# Patient Record
Sex: Female | Born: 1955
Health system: Southern US, Community
[De-identification: ages and names within clinical notes are randomized; demographics above are authoritative.]

## PROBLEM LIST (undated history)

## (undated) DIAGNOSIS — R0600 Dyspnea, unspecified: Secondary | ICD-10-CM

## (undated) DIAGNOSIS — N3281 Overactive bladder: Secondary | ICD-10-CM

## (undated) DIAGNOSIS — N950 Postmenopausal bleeding: Secondary | ICD-10-CM

## (undated) DIAGNOSIS — E785 Hyperlipidemia, unspecified: Secondary | ICD-10-CM

## (undated) DIAGNOSIS — D219 Benign neoplasm of connective and other soft tissue, unspecified: Secondary | ICD-10-CM

## (undated) DIAGNOSIS — E119 Type 2 diabetes mellitus without complications: Secondary | ICD-10-CM

## (undated) DIAGNOSIS — N915 Oligomenorrhea, unspecified: Secondary | ICD-10-CM

## (undated) DIAGNOSIS — Z8742 Personal history of other diseases of the female genital tract: Secondary | ICD-10-CM

## (undated) DIAGNOSIS — C50912 Malignant neoplasm of unspecified site of left female breast: Secondary | ICD-10-CM

## (undated) DIAGNOSIS — Z8639 Personal history of other endocrine, nutritional and metabolic disease: Secondary | ICD-10-CM

## (undated) DIAGNOSIS — D649 Anemia, unspecified: Secondary | ICD-10-CM

## (undated) DIAGNOSIS — F329 Major depressive disorder, single episode, unspecified: Secondary | ICD-10-CM

## (undated) DIAGNOSIS — N3090 Cystitis, unspecified without hematuria: Secondary | ICD-10-CM

## (undated) DIAGNOSIS — D259 Leiomyoma of uterus, unspecified: Secondary | ICD-10-CM

## (undated) DIAGNOSIS — C50919 Malignant neoplasm of unspecified site of unspecified female breast: Secondary | ICD-10-CM

## (undated) DIAGNOSIS — N3946 Mixed incontinence: Secondary | ICD-10-CM

## (undated) DIAGNOSIS — I8390 Asymptomatic varicose veins of unspecified lower extremity: Secondary | ICD-10-CM

## (undated) DIAGNOSIS — R51 Headache: Secondary | ICD-10-CM

## (undated) DIAGNOSIS — G4700 Insomnia, unspecified: Secondary | ICD-10-CM

## (undated) DIAGNOSIS — R600 Localized edema: Secondary | ICD-10-CM

## (undated) DIAGNOSIS — I10 Essential (primary) hypertension: Secondary | ICD-10-CM

## (undated) DIAGNOSIS — R102 Pelvic and perineal pain: Secondary | ICD-10-CM

## (undated) DIAGNOSIS — IMO0002 Reserved for concepts with insufficient information to code with codable children: Secondary | ICD-10-CM

## (undated) DIAGNOSIS — R9389 Abnormal findings on diagnostic imaging of other specified body structures: Secondary | ICD-10-CM

## (undated) DIAGNOSIS — K5909 Other constipation: Secondary | ICD-10-CM

## (undated) DIAGNOSIS — N83209 Unspecified ovarian cyst, unspecified side: Secondary | ICD-10-CM

## (undated) DIAGNOSIS — Z973 Presence of spectacles and contact lenses: Secondary | ICD-10-CM

## (undated) HISTORY — DX: Personal history of other endocrine, nutritional and metabolic disease: Z86.39

## (undated) HISTORY — DX: Anemia, unspecified: D64.9

## (undated) HISTORY — DX: Type 2 diabetes mellitus without complications: E11.9

## (undated) HISTORY — PX: BREAST SURGERY: SHX581

## (undated) HISTORY — DX: Malignant neoplasm of unspecified site of left female breast: C50.912

## (undated) HISTORY — DX: Essential (primary) hypertension: I10

## (undated) HISTORY — DX: Unspecified ovarian cyst, unspecified side: N83.209

## (undated) HISTORY — DX: Asymptomatic varicose veins of unspecified lower extremity: I83.90

## (undated) HISTORY — DX: Pelvic and perineal pain: R10.2

## (undated) HISTORY — DX: Benign neoplasm of connective and other soft tissue, unspecified: D21.9

## (undated) HISTORY — DX: Major depressive disorder, single episode, unspecified: F32.9

## (undated) HISTORY — DX: Cystitis, unspecified without hematuria: N30.90

## (undated) HISTORY — PX: COLONOSCOPY: SHX174

## (undated) HISTORY — DX: Oligomenorrhea, unspecified: N91.5

## (undated) HISTORY — DX: Headache: R51

## (undated) HISTORY — DX: Personal history of other diseases of the female genital tract: Z87.42

## (undated) HISTORY — DX: Reserved for concepts with insufficient information to code with codable children: IMO0002

## (undated) HISTORY — PX: OTHER SURGICAL HISTORY: SHX169

---

## 1991-05-25 DIAGNOSIS — IMO0002 Reserved for concepts with insufficient information to code with codable children: Secondary | ICD-10-CM

## 1991-05-25 DIAGNOSIS — Z8742 Personal history of other diseases of the female genital tract: Secondary | ICD-10-CM

## 1991-05-25 DIAGNOSIS — R87619 Unspecified abnormal cytological findings in specimens from cervix uteri: Secondary | ICD-10-CM

## 1991-05-25 HISTORY — DX: Personal history of other diseases of the female genital tract: Z87.42

## 1991-05-25 HISTORY — DX: Unspecified abnormal cytological findings in specimens from cervix uteri: R87.619

## 1991-05-25 HISTORY — DX: Reserved for concepts with insufficient information to code with codable children: IMO0002

## 1991-06-18 HISTORY — PX: COLPOSCOPY: SHX161

## 1991-07-16 DIAGNOSIS — F32A Depression, unspecified: Secondary | ICD-10-CM

## 1991-07-16 HISTORY — DX: Depression, unspecified: F32.A

## 1992-05-17 HISTORY — PX: DIAGNOSTIC LAPAROSCOPY: SUR761

## 1995-05-18 HISTORY — PX: TUBAL LIGATION: SHX77

## 1995-05-18 HISTORY — PX: LAPAROSCOPIC TUBAL LIGATION: SHX1937

## 1998-11-11 ENCOUNTER — Other Ambulatory Visit: Admission: RE | Admit: 1998-11-11 | Discharge: 1998-11-11 | Payer: Self-pay | Admitting: Obstetrics and Gynecology

## 1998-12-26 ENCOUNTER — Ambulatory Visit (HOSPITAL_COMMUNITY): Admission: RE | Admit: 1998-12-26 | Discharge: 1998-12-26 | Payer: Self-pay | Admitting: Gastroenterology

## 1999-01-06 ENCOUNTER — Other Ambulatory Visit: Admission: RE | Admit: 1999-01-06 | Discharge: 1999-01-06 | Payer: Self-pay | Admitting: Family Medicine

## 1999-12-29 ENCOUNTER — Other Ambulatory Visit: Admission: RE | Admit: 1999-12-29 | Discharge: 1999-12-29 | Payer: Self-pay | Admitting: Obstetrics and Gynecology

## 2000-01-21 ENCOUNTER — Encounter: Payer: Self-pay | Admitting: Obstetrics and Gynecology

## 2000-01-21 ENCOUNTER — Ambulatory Visit (HOSPITAL_COMMUNITY): Admission: RE | Admit: 2000-01-21 | Discharge: 2000-01-21 | Payer: Self-pay | Admitting: Obstetrics and Gynecology

## 2000-01-26 ENCOUNTER — Encounter: Admission: RE | Admit: 2000-01-26 | Discharge: 2000-01-26 | Payer: Self-pay | Admitting: Obstetrics and Gynecology

## 2000-01-26 ENCOUNTER — Encounter: Payer: Self-pay | Admitting: Obstetrics and Gynecology

## 2000-11-25 ENCOUNTER — Encounter: Payer: Self-pay | Admitting: Neurology

## 2000-11-25 ENCOUNTER — Ambulatory Visit (HOSPITAL_COMMUNITY): Admission: RE | Admit: 2000-11-25 | Discharge: 2000-11-25 | Payer: Self-pay | Admitting: Neurology

## 2000-11-29 ENCOUNTER — Ambulatory Visit (HOSPITAL_COMMUNITY): Admission: RE | Admit: 2000-11-29 | Discharge: 2000-11-29 | Payer: Self-pay | Admitting: Family Medicine

## 2000-11-29 ENCOUNTER — Encounter: Payer: Self-pay | Admitting: Family Medicine

## 2001-01-30 ENCOUNTER — Ambulatory Visit (HOSPITAL_COMMUNITY): Admission: RE | Admit: 2001-01-30 | Discharge: 2001-01-30 | Payer: Self-pay | Admitting: Family Medicine

## 2001-01-30 ENCOUNTER — Encounter: Payer: Self-pay | Admitting: Family Medicine

## 2002-05-09 ENCOUNTER — Encounter: Payer: Self-pay | Admitting: Obstetrics and Gynecology

## 2002-05-09 ENCOUNTER — Encounter: Admission: RE | Admit: 2002-05-09 | Discharge: 2002-05-09 | Payer: Self-pay | Admitting: Obstetrics and Gynecology

## 2002-06-07 ENCOUNTER — Other Ambulatory Visit: Admission: RE | Admit: 2002-06-07 | Discharge: 2002-06-07 | Payer: Self-pay | Admitting: Obstetrics and Gynecology

## 2002-06-14 ENCOUNTER — Ambulatory Visit (HOSPITAL_COMMUNITY): Admission: RE | Admit: 2002-06-14 | Discharge: 2002-06-14 | Payer: Self-pay | Admitting: Obstetrics and Gynecology

## 2002-06-14 ENCOUNTER — Encounter: Payer: Self-pay | Admitting: Obstetrics and Gynecology

## 2003-09-12 ENCOUNTER — Ambulatory Visit (HOSPITAL_COMMUNITY): Admission: RE | Admit: 2003-09-12 | Discharge: 2003-09-12 | Payer: Self-pay | Admitting: Obstetrics and Gynecology

## 2003-09-17 ENCOUNTER — Encounter: Admission: RE | Admit: 2003-09-17 | Discharge: 2003-09-17 | Payer: Self-pay | Admitting: Obstetrics and Gynecology

## 2004-08-26 ENCOUNTER — Other Ambulatory Visit: Admission: RE | Admit: 2004-08-26 | Discharge: 2004-08-26 | Payer: Self-pay | Admitting: Obstetrics and Gynecology

## 2004-09-10 ENCOUNTER — Encounter: Admission: RE | Admit: 2004-09-10 | Discharge: 2004-09-10 | Payer: Self-pay | Admitting: Obstetrics and Gynecology

## 2005-09-05 ENCOUNTER — Emergency Department (HOSPITAL_COMMUNITY): Admission: EM | Admit: 2005-09-05 | Discharge: 2005-09-05 | Payer: Self-pay | Admitting: Emergency Medicine

## 2006-05-17 DIAGNOSIS — C50812 Malignant neoplasm of overlapping sites of left female breast: Secondary | ICD-10-CM

## 2006-05-17 HISTORY — PX: MASTECTOMY: SHX3

## 2006-05-17 HISTORY — DX: Malignant neoplasm of overlapping sites of left female breast: C50.812

## 2006-05-18 ENCOUNTER — Encounter: Admission: RE | Admit: 2006-05-18 | Discharge: 2006-05-18 | Payer: Self-pay | Admitting: Obstetrics and Gynecology

## 2006-06-02 ENCOUNTER — Encounter: Admission: RE | Admit: 2006-06-02 | Discharge: 2006-06-02 | Payer: Self-pay | Admitting: Obstetrics and Gynecology

## 2006-06-08 ENCOUNTER — Encounter (INDEPENDENT_AMBULATORY_CARE_PROVIDER_SITE_OTHER): Payer: Self-pay | Admitting: Diagnostic Radiology

## 2006-06-08 ENCOUNTER — Encounter (INDEPENDENT_AMBULATORY_CARE_PROVIDER_SITE_OTHER): Payer: Self-pay | Admitting: Specialist

## 2006-06-08 ENCOUNTER — Encounter: Admission: RE | Admit: 2006-06-08 | Discharge: 2006-06-08 | Payer: Self-pay | Admitting: Obstetrics and Gynecology

## 2006-06-24 ENCOUNTER — Encounter: Admission: RE | Admit: 2006-06-24 | Discharge: 2006-06-24 | Payer: Self-pay | Admitting: Orthopedic Surgery

## 2006-07-08 ENCOUNTER — Encounter (INDEPENDENT_AMBULATORY_CARE_PROVIDER_SITE_OTHER): Payer: Self-pay | Admitting: Specialist

## 2006-07-08 ENCOUNTER — Encounter: Admission: RE | Admit: 2006-07-08 | Discharge: 2006-07-08 | Payer: Self-pay | Admitting: Obstetrics and Gynecology

## 2006-07-08 ENCOUNTER — Encounter (INDEPENDENT_AMBULATORY_CARE_PROVIDER_SITE_OTHER): Payer: Self-pay | Admitting: Diagnostic Radiology

## 2006-08-19 ENCOUNTER — Ambulatory Visit (HOSPITAL_COMMUNITY): Admission: RE | Admit: 2006-08-19 | Discharge: 2006-08-21 | Payer: Self-pay | Admitting: General Surgery

## 2006-08-19 ENCOUNTER — Encounter (INDEPENDENT_AMBULATORY_CARE_PROVIDER_SITE_OTHER): Payer: Self-pay | Admitting: Specialist

## 2006-08-19 HISTORY — PX: SIMPLE MASTECTOMY WITH AXILLARY SENTINEL NODE BIOPSY: SHX6098

## 2006-09-07 ENCOUNTER — Encounter: Admission: RE | Admit: 2006-09-07 | Discharge: 2006-12-06 | Payer: Self-pay | Admitting: *Deleted

## 2006-09-14 ENCOUNTER — Ambulatory Visit: Payer: Self-pay | Admitting: Oncology

## 2006-09-28 LAB — COMPREHENSIVE METABOLIC PANEL
AST: 10 U/L (ref 0–37)
Albumin: 3.4 g/dL — ABNORMAL LOW (ref 3.5–5.2)
BUN: 6 mg/dL (ref 6–23)
Calcium: 8.9 mg/dL (ref 8.4–10.5)
Chloride: 104 mEq/L (ref 96–112)
Creatinine, Ser: 0.56 mg/dL (ref 0.40–1.20)
Glucose, Bld: 141 mg/dL — ABNORMAL HIGH (ref 70–99)
Potassium: 4.2 mEq/L (ref 3.5–5.3)

## 2006-09-28 LAB — CBC WITH DIFFERENTIAL/PLATELET
Basophils Absolute: 0.1 10*3/uL (ref 0.0–0.1)
EOS%: 1 % (ref 0.0–7.0)
Eosinophils Absolute: 0.1 10*3/uL (ref 0.0–0.5)
HCT: 31.9 % — ABNORMAL LOW (ref 34.8–46.6)
HGB: 10.4 g/dL — ABNORMAL LOW (ref 11.6–15.9)
MCH: 27.1 pg (ref 26.0–34.0)
MCV: 83.5 fL (ref 81.0–101.0)
MONO%: 2.8 % (ref 0.0–13.0)
NEUT#: 6.5 10*3/uL (ref 1.5–6.5)
NEUT%: 70.6 % (ref 39.6–76.8)
lymph#: 2.3 10*3/uL (ref 0.9–3.3)

## 2006-09-28 LAB — CANCER ANTIGEN 27.29: CA 27.29: 11 U/mL (ref 0–39)

## 2006-09-28 LAB — LACTATE DEHYDROGENASE: LDH: 117 U/L (ref 94–250)

## 2006-11-15 ENCOUNTER — Ambulatory Visit: Payer: Self-pay | Admitting: Oncology

## 2006-11-21 LAB — CBC WITH DIFFERENTIAL/PLATELET
EOS%: 0.7 % (ref 0.0–7.0)
HGB: 12.5 g/dL (ref 11.6–15.9)
MCH: 26.9 pg (ref 26.0–34.0)
MCV: 80.8 fL — ABNORMAL LOW (ref 81.0–101.0)
MONO%: 4 % (ref 0.0–13.0)
NEUT#: 4.2 10*3/uL (ref 1.5–6.5)
RBC: 4.63 10*6/uL (ref 3.70–5.32)
RDW: 16.5 % — ABNORMAL HIGH (ref 11.3–14.5)
lymph#: 3 10*3/uL (ref 0.9–3.3)

## 2006-11-21 LAB — COMPREHENSIVE METABOLIC PANEL
ALT: 9 U/L (ref 0–35)
AST: 15 U/L (ref 0–37)
Albumin: 4.5 g/dL (ref 3.5–5.2)
Alkaline Phosphatase: 67 U/L (ref 39–117)
Calcium: 9.9 mg/dL (ref 8.4–10.5)
Chloride: 102 mEq/L (ref 96–112)
Potassium: 4.5 mEq/L (ref 3.5–5.3)
Sodium: 138 mEq/L (ref 135–145)
Total Protein: 8.2 g/dL (ref 6.0–8.3)

## 2007-01-13 ENCOUNTER — Ambulatory Visit: Payer: Self-pay | Admitting: Oncology

## 2007-01-19 LAB — CBC WITH DIFFERENTIAL/PLATELET
BASO%: 0.5 % (ref 0.0–2.0)
EOS%: 0.8 % (ref 0.0–7.0)
Eosinophils Absolute: 0 10*3/uL (ref 0.0–0.5)
LYMPH%: 49.8 % — ABNORMAL HIGH (ref 14.0–48.0)
MCH: 27.7 pg (ref 26.0–34.0)
MCHC: 33.7 g/dL (ref 32.0–36.0)
MCV: 82.2 fL (ref 81.0–101.0)
MONO%: 6.1 % (ref 0.0–13.0)
Platelets: 186 10*3/uL (ref 145–400)
RBC: 3.96 10*6/uL (ref 3.70–5.32)

## 2007-01-19 LAB — COMPREHENSIVE METABOLIC PANEL
ALT: 9 U/L (ref 0–35)
AST: 13 U/L (ref 0–37)
Alkaline Phosphatase: 53 U/L (ref 39–117)
Creatinine, Ser: 0.64 mg/dL (ref 0.40–1.20)
Sodium: 137 mEq/L (ref 135–145)
Total Bilirubin: 0.3 mg/dL (ref 0.3–1.2)

## 2007-01-19 LAB — CANCER ANTIGEN 27.29: CA 27.29: 13 U/mL (ref 0–39)

## 2007-03-09 ENCOUNTER — Ambulatory Visit (HOSPITAL_COMMUNITY): Admission: RE | Admit: 2007-03-09 | Discharge: 2007-03-09 | Payer: Self-pay | Admitting: Obstetrics and Gynecology

## 2007-05-24 ENCOUNTER — Inpatient Hospital Stay (HOSPITAL_COMMUNITY): Admission: AD | Admit: 2007-05-24 | Discharge: 2007-05-24 | Payer: Self-pay | Admitting: Obstetrics and Gynecology

## 2007-06-05 ENCOUNTER — Encounter: Admission: RE | Admit: 2007-06-05 | Discharge: 2007-06-05 | Payer: Self-pay | Admitting: Gastroenterology

## 2007-07-17 ENCOUNTER — Ambulatory Visit: Payer: Self-pay | Admitting: Oncology

## 2007-07-19 LAB — CBC WITH DIFFERENTIAL/PLATELET
Basophils Absolute: 0 10*3/uL (ref 0.0–0.1)
Eosinophils Absolute: 0.1 10*3/uL (ref 0.0–0.5)
HGB: 11.4 g/dL — ABNORMAL LOW (ref 11.6–15.9)
MCV: 82.6 fL (ref 81.0–101.0)
MONO#: 0.4 10*3/uL (ref 0.1–0.9)
MONO%: 6.4 % (ref 0.0–13.0)
NEUT#: 2.5 10*3/uL (ref 1.5–6.5)
RDW: 15.1 % — ABNORMAL HIGH (ref 11.3–14.5)

## 2007-07-19 LAB — COMPREHENSIVE METABOLIC PANEL
Albumin: 3.3 g/dL — ABNORMAL LOW (ref 3.5–5.2)
Alkaline Phosphatase: 58 U/L (ref 39–117)
BUN: 11 mg/dL (ref 6–23)
CO2: 25 mEq/L (ref 19–32)
Calcium: 9 mg/dL (ref 8.4–10.5)
Chloride: 108 mEq/L (ref 96–112)
Glucose, Bld: 118 mg/dL — ABNORMAL HIGH (ref 70–99)
Potassium: 4.1 mEq/L (ref 3.5–5.3)

## 2007-07-20 ENCOUNTER — Ambulatory Visit (HOSPITAL_COMMUNITY): Admission: RE | Admit: 2007-07-20 | Discharge: 2007-07-20 | Payer: Self-pay | Admitting: Obstetrics and Gynecology

## 2007-07-20 ENCOUNTER — Encounter (INDEPENDENT_AMBULATORY_CARE_PROVIDER_SITE_OTHER): Payer: Self-pay | Admitting: Obstetrics and Gynecology

## 2007-07-20 HISTORY — PX: DILATATION & CURRETTAGE/HYSTEROSCOPY WITH RESECTOCOPE: SHX5572

## 2007-07-20 HISTORY — PX: HYSTEROSCOPY WITH D & C: SHX1775

## 2007-07-20 LAB — CANCER ANTIGEN 27.29: CA 27.29: 14 U/mL (ref 0–39)

## 2007-08-01 ENCOUNTER — Encounter: Admission: RE | Admit: 2007-08-01 | Discharge: 2007-08-01 | Payer: Self-pay | Admitting: Oncology

## 2007-08-18 ENCOUNTER — Encounter (INDEPENDENT_AMBULATORY_CARE_PROVIDER_SITE_OTHER): Payer: Self-pay | Admitting: Diagnostic Radiology

## 2007-08-18 ENCOUNTER — Encounter: Admission: RE | Admit: 2007-08-18 | Discharge: 2007-08-18 | Payer: Self-pay | Admitting: Oncology

## 2007-08-21 LAB — ESTRADIOL, ULTRA SENS: Estradiol, Ultra Sensitive: 132 pg/mL

## 2007-10-27 ENCOUNTER — Emergency Department: Payer: Self-pay | Admitting: Emergency Medicine

## 2007-10-27 ENCOUNTER — Other Ambulatory Visit: Payer: Self-pay

## 2008-01-18 ENCOUNTER — Ambulatory Visit: Payer: Self-pay | Admitting: Oncology

## 2008-03-18 ENCOUNTER — Ambulatory Visit: Payer: Self-pay | Admitting: Oncology

## 2008-03-20 LAB — CBC WITH DIFFERENTIAL/PLATELET
Basophils Absolute: 0.1 10*3/uL (ref 0.0–0.1)
HCT: 36.1 % (ref 34.8–46.6)
HGB: 11.9 g/dL (ref 11.6–15.9)
MONO#: 0.2 10*3/uL (ref 0.1–0.9)
NEUT%: 46.1 % (ref 39.6–76.8)
WBC: 6.6 10*3/uL (ref 3.9–10.0)
lymph#: 3.2 10*3/uL (ref 0.9–3.3)

## 2008-03-20 LAB — COMPREHENSIVE METABOLIC PANEL
ALT: 22 U/L (ref 0–35)
Alkaline Phosphatase: 63 U/L (ref 39–117)
Glucose, Bld: 150 mg/dL — ABNORMAL HIGH (ref 70–99)
Sodium: 137 mEq/L (ref 135–145)
Total Bilirubin: 0.3 mg/dL (ref 0.3–1.2)
Total Protein: 7.7 g/dL (ref 6.0–8.3)

## 2008-03-20 LAB — CANCER ANTIGEN 27.29: CA 27.29: 15 U/mL (ref 0–39)

## 2008-03-29 ENCOUNTER — Encounter: Admission: RE | Admit: 2008-03-29 | Discharge: 2008-03-29 | Payer: Self-pay | Admitting: Oncology

## 2008-03-31 LAB — ESTRADIOL, ULTRA SENS

## 2008-09-09 ENCOUNTER — Ambulatory Visit: Payer: Self-pay | Admitting: Oncology

## 2008-10-30 ENCOUNTER — Ambulatory Visit: Payer: Self-pay | Admitting: Oncology

## 2008-11-08 LAB — CBC WITH DIFFERENTIAL/PLATELET
BASO%: 0.1 % (ref 0.0–2.0)
HCT: 36.3 % (ref 34.8–46.6)
LYMPH%: 48.1 % (ref 14.0–49.7)
MCHC: 33.4 g/dL (ref 31.5–36.0)
MCV: 85.3 fL (ref 79.5–101.0)
MONO#: 0.4 10*3/uL (ref 0.1–0.9)
MONO%: 5.9 % (ref 0.0–14.0)
NEUT%: 43.8 % (ref 38.4–76.8)
Platelets: 179 10*3/uL (ref 145–400)
WBC: 6.7 10*3/uL (ref 3.9–10.3)

## 2008-11-08 LAB — CANCER ANTIGEN 27.29: CA 27.29: 18 U/mL (ref 0–39)

## 2008-11-08 LAB — COMPREHENSIVE METABOLIC PANEL
ALT: 24 U/L (ref 0–35)
CO2: 28 mEq/L (ref 19–32)
Creatinine, Ser: 0.58 mg/dL (ref 0.40–1.20)
Glucose, Bld: 79 mg/dL (ref 70–99)
Total Bilirubin: 0.4 mg/dL (ref 0.3–1.2)

## 2008-11-12 ENCOUNTER — Encounter: Admission: RE | Admit: 2008-11-12 | Discharge: 2008-11-12 | Payer: Self-pay | Admitting: Oncology

## 2009-01-25 IMAGING — CR DG CHEST 2V
2 series · 2 of 2 positions shown · non-contrast
Comparison: none
The heart size and mediastinal contours are within normal limits.  Both lungs are clear.  The visualized skeletal structures are unremarkable.

CLINICAL DATA: Left breast carcinoma.  Preadmission evaluation.
MG1CA-B VIEWS:

[view not recorded (1 of 2)]
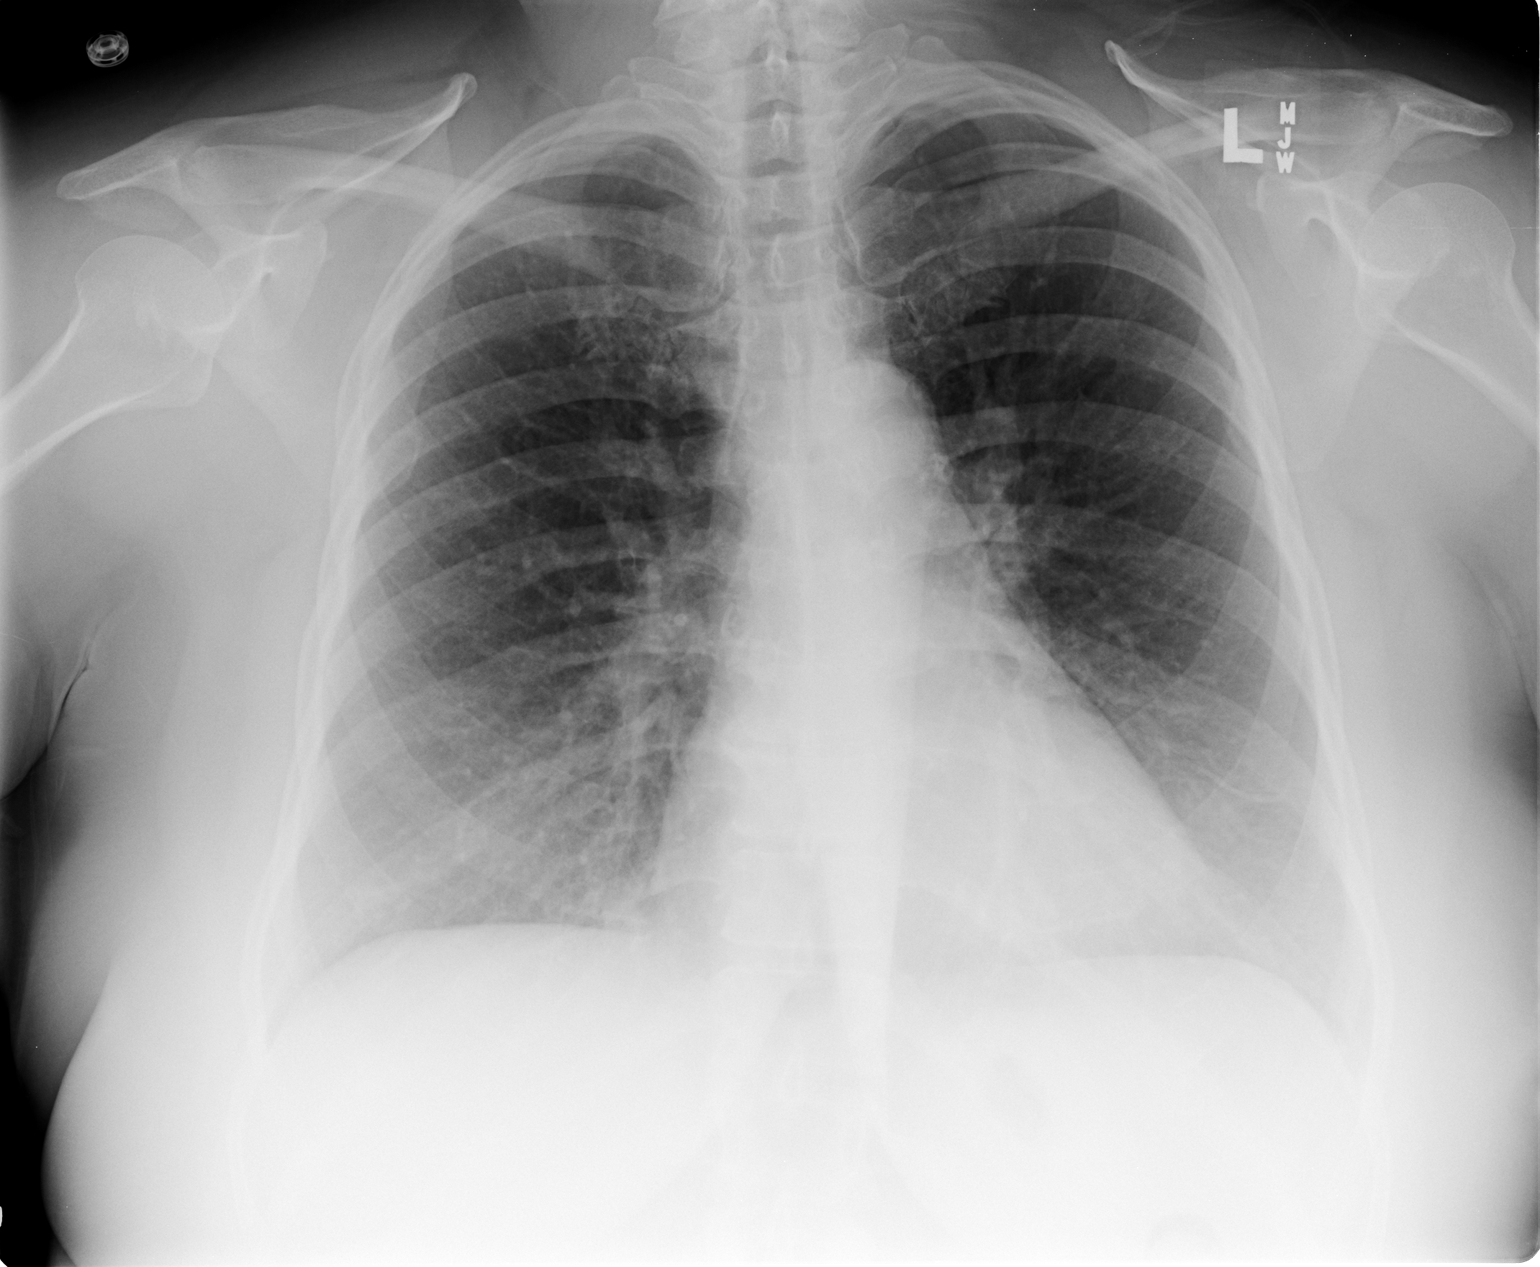

[view not recorded (2 of 2)]
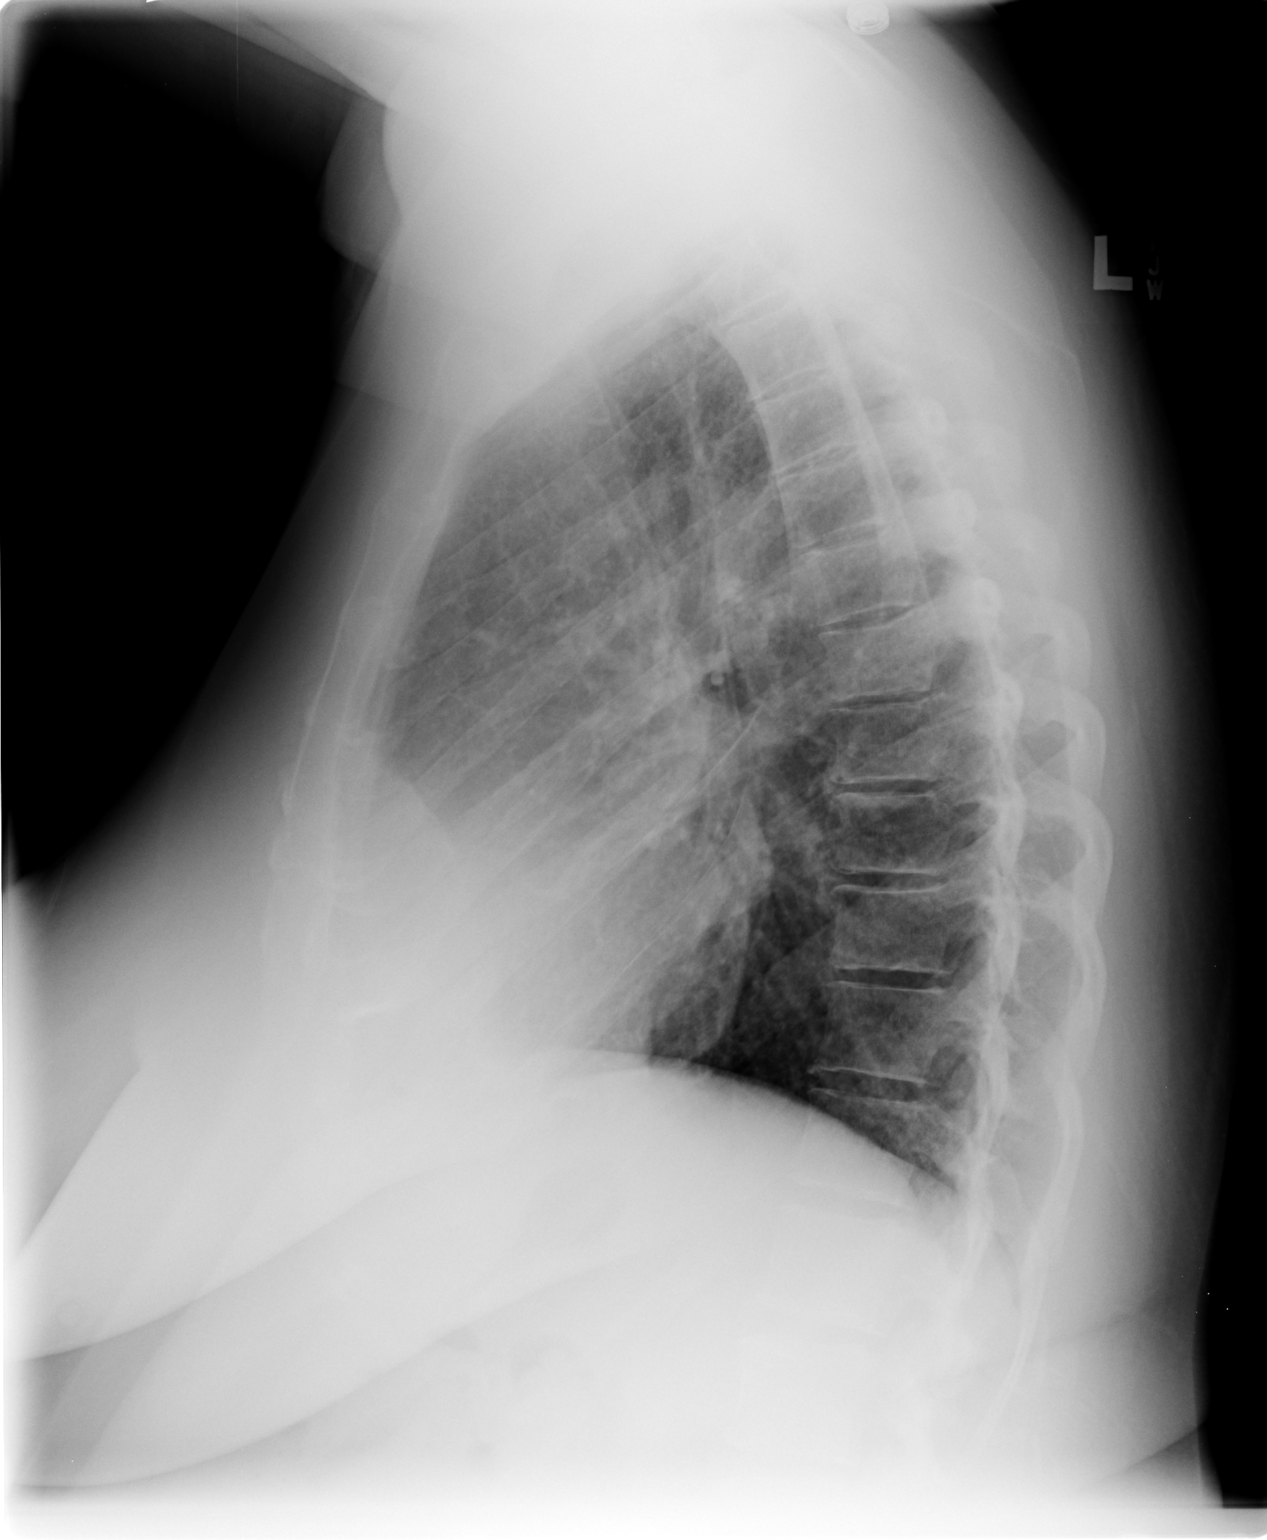

[2 of 2 positions shown; findings below may reference images not displayed]

IMPRESSION: No active cardiopulmonary disease.

## 2009-05-13 ENCOUNTER — Ambulatory Visit: Payer: Self-pay | Admitting: Oncology

## 2009-07-14 ENCOUNTER — Ambulatory Visit: Payer: Self-pay | Admitting: Oncology

## 2009-07-17 LAB — CBC WITH DIFFERENTIAL/PLATELET
Basophils Absolute: 0.1 10*3/uL (ref 0.0–0.1)
Eosinophils Absolute: 0.1 10*3/uL (ref 0.0–0.5)
HCT: 35.4 % (ref 34.8–46.6)
HGB: 11.7 g/dL (ref 11.6–15.9)
NEUT#: 3.3 10*3/uL (ref 1.5–6.5)
RDW: 14.4 % (ref 11.2–14.5)
lymph#: 3 10*3/uL (ref 0.9–3.3)

## 2009-07-17 LAB — COMPREHENSIVE METABOLIC PANEL
Albumin: 3.4 g/dL — ABNORMAL LOW (ref 3.5–5.2)
BUN: 10 mg/dL (ref 6–23)
CO2: 26 mEq/L (ref 19–32)
Calcium: 8.9 mg/dL (ref 8.4–10.5)
Chloride: 106 mEq/L (ref 96–112)
Glucose, Bld: 170 mg/dL — ABNORMAL HIGH (ref 70–99)
Potassium: 4 mEq/L (ref 3.5–5.3)

## 2009-07-17 LAB — VITAMIN D 25 HYDROXY (VIT D DEFICIENCY, FRACTURES): Vit D, 25-Hydroxy: 19 ng/mL — ABNORMAL LOW (ref 30–89)

## 2009-07-17 LAB — CANCER ANTIGEN 27.29: CA 27.29: 13 U/mL (ref 0–39)

## 2009-08-15 ENCOUNTER — Ambulatory Visit: Payer: Self-pay | Admitting: Oncology

## 2009-11-19 ENCOUNTER — Ambulatory Visit: Payer: Self-pay | Admitting: Oncology

## 2009-12-12 ENCOUNTER — Encounter: Admission: RE | Admit: 2009-12-12 | Discharge: 2009-12-12 | Payer: Self-pay | Admitting: Internal Medicine

## 2009-12-23 ENCOUNTER — Encounter: Admission: RE | Admit: 2009-12-23 | Discharge: 2009-12-23 | Payer: Self-pay | Admitting: Oncology

## 2010-02-09 ENCOUNTER — Ambulatory Visit: Payer: Self-pay | Admitting: Oncology

## 2010-03-06 LAB — COMPREHENSIVE METABOLIC PANEL
AST: 23 U/L (ref 0–37)
Albumin: 3.4 g/dL — ABNORMAL LOW (ref 3.5–5.2)
Alkaline Phosphatase: 67 U/L (ref 39–117)
BUN: 10 mg/dL (ref 6–23)
CO2: 28 mEq/L (ref 19–32)
Chloride: 109 mEq/L (ref 96–112)
Glucose, Bld: 140 mg/dL — ABNORMAL HIGH (ref 70–99)
Potassium: 4.1 mEq/L (ref 3.5–5.3)
Sodium: 142 mEq/L (ref 135–145)
Total Bilirubin: 0.2 mg/dL — ABNORMAL LOW (ref 0.3–1.2)
Total Protein: 7 g/dL (ref 6.0–8.3)

## 2010-03-06 LAB — CBC WITH DIFFERENTIAL/PLATELET
EOS%: 1.2 % (ref 0.0–7.0)
HCT: 34.7 % — ABNORMAL LOW (ref 34.8–46.6)
MCH: 27.9 pg (ref 25.1–34.0)
MCHC: 32.7 g/dL (ref 31.5–36.0)
MCV: 85.2 fL (ref 79.5–101.0)
MONO#: 0.4 10*3/uL (ref 0.1–0.9)
MONO%: 5.8 % (ref 0.0–14.0)
Platelets: 178 10*3/uL (ref 145–400)

## 2010-03-07 LAB — VITAMIN D 25 HYDROXY (VIT D DEFICIENCY, FRACTURES): Vit D, 25-Hydroxy: 36 ng/mL (ref 30–89)

## 2010-06-06 ENCOUNTER — Encounter: Payer: Self-pay | Admitting: Oncology

## 2010-06-07 ENCOUNTER — Encounter (HOSPITAL_BASED_OUTPATIENT_CLINIC_OR_DEPARTMENT_OTHER): Payer: Self-pay | Admitting: General Surgery

## 2010-06-07 ENCOUNTER — Encounter: Payer: Self-pay | Admitting: Obstetrics and Gynecology

## 2010-06-18 ENCOUNTER — Other Ambulatory Visit: Payer: Self-pay | Admitting: Oncology

## 2010-06-18 DIAGNOSIS — Z9012 Acquired absence of left breast and nipple: Secondary | ICD-10-CM

## 2010-06-24 ENCOUNTER — Ambulatory Visit
Admission: RE | Admit: 2010-06-24 | Discharge: 2010-06-24 | Disposition: A | Discharge status: Home / Self Care | Payer: BC Managed Care – PPO | Source: Ambulatory Visit | Attending: Oncology | Admitting: Oncology

## 2010-06-24 ENCOUNTER — Other Ambulatory Visit: Payer: Self-pay | Admitting: Oncology

## 2010-06-24 DIAGNOSIS — Z9012 Acquired absence of left breast and nipple: Secondary | ICD-10-CM

## 2010-07-01 ENCOUNTER — Other Ambulatory Visit: Payer: Self-pay

## 2010-07-22 ENCOUNTER — Ambulatory Visit
Admission: RE | Admit: 2010-07-22 | Discharge: 2010-07-22 | Disposition: A | Discharge status: Home / Self Care | Payer: BC Managed Care – PPO | Source: Ambulatory Visit | Attending: Oncology | Admitting: Oncology

## 2010-07-22 DIAGNOSIS — Z9012 Acquired absence of left breast and nipple: Secondary | ICD-10-CM

## 2010-07-22 MED ORDER — GADOBENATE DIMEGLUMINE 529 MG/ML IV SOLN
19.0000 mL | Freq: Once | INTRAVENOUS | Status: AC | PRN
  Administered 2010-07-22: 19 mL via INTRAVENOUS

## 2010-08-20 ENCOUNTER — Other Ambulatory Visit: Payer: Self-pay | Admitting: Oncology

## 2010-08-20 ENCOUNTER — Encounter (HOSPITAL_BASED_OUTPATIENT_CLINIC_OR_DEPARTMENT_OTHER): Payer: BC Managed Care – PPO | Admitting: Oncology

## 2010-08-20 DIAGNOSIS — C50919 Malignant neoplasm of unspecified site of unspecified female breast: Secondary | ICD-10-CM

## 2010-08-20 DIAGNOSIS — C50419 Malignant neoplasm of upper-outer quadrant of unspecified female breast: Secondary | ICD-10-CM

## 2010-08-20 DIAGNOSIS — Z170 Estrogen receptor positive status [ER+]: Secondary | ICD-10-CM

## 2010-08-20 LAB — CBC WITH DIFFERENTIAL/PLATELET
BASO%: 0.4 % (ref 0.0–2.0)
LYMPH%: 41.9 % (ref 14.0–49.7)
MCHC: 33.1 g/dL (ref 31.5–36.0)
MONO#: 0.4 10*3/uL (ref 0.1–0.9)
Platelets: 159 10*3/uL (ref 145–400)
RBC: 4.17 10*6/uL (ref 3.70–5.45)
RDW: 15.3 % — ABNORMAL HIGH (ref 11.2–14.5)
WBC: 6.6 10*3/uL (ref 3.9–10.3)
lymph#: 2.8 10*3/uL (ref 0.9–3.3)

## 2010-08-20 LAB — COMPREHENSIVE METABOLIC PANEL
AST: 21 U/L (ref 0–37)
Albumin: 3.5 g/dL (ref 3.5–5.2)
Alkaline Phosphatase: 63 U/L (ref 39–117)
BUN: 14 mg/dL (ref 6–23)
Creatinine, Ser: 0.82 mg/dL (ref 0.40–1.20)
Glucose, Bld: 117 mg/dL — ABNORMAL HIGH (ref 70–99)
Potassium: 4.3 mEq/L (ref 3.5–5.3)
Total Bilirubin: 0.6 mg/dL (ref 0.3–1.2)

## 2010-08-21 LAB — VITAMIN D 25 HYDROXY (VIT D DEFICIENCY, FRACTURES): Vit D, 25-Hydroxy: 32 ng/mL (ref 30–89)

## 2010-09-29 NOTE — Consult Note (Signed)
NAMEMONIC, ENGELMANN NO.:  [id number]   MEDICAL RECORD NO.:  [id number]          PATIENT TYPE:  MAT   LOCATION:  [facility]                          FACILITY:  [facility]   PHYSICIAN:  Janine Limbo, M.D.DATE OF BIRTH:  07-Mar-1956   DATE OF CONSULTATION:  05/24/2007  DATE OF DISCHARGE:  05/24/2007                                 CONSULTATION   REFERRING PHYSICIAN:  Dr. Erie Noe P. Haygood   HISTORY OF PRESENT ILLNESS:  [name] is a 55 year old female, para  2-0-0-2, who presents to the maternity admissions area at the [facility] complaining of heavy vaginal bleeding.  The  patient has been followed at the [facility] of  [facility] for women.  The patient has a history of irregular  bleeding and the patient says that she thinks she has been told in the  past that she has fibroids.  The patient has never really ceased having  her periods in spite of her age of 60.  She was recently diagnosed with  breast cancer and her breast cancer is estrogen receptor positive and  progesterone receptor positive.  She has been warned not to take any  hormones at all.  The patient reports that at work today she felt  slightly dizzy and felt that she needed to be evaluated.  She says that  her bleeding was heavy enough that she soaked through a pad every 3  hours.  The patient is status post tubal ligation in 1998.  Her most  recent Pap smear was within normal limits and it was done 1 month ago.  She said that she has had an abnormal Pap smear in the past but that her  follow-up evaluation showed that the cells were once again normal.   PAST MEDICAL HISTORY:  The patient was diagnosed with breast cancer as  mentioned above.  Her breast cancer is estrogen receptor positive and  progesterone receptor positive.  She has high cholesterol but is  currently not on medication.   DRUG ALLERGIES:  The patient is allergic to AMOXICILLIN and  she reports  that amoxicillin causes her face to swell.  She is also allergic to  KIWI.  She denies latex allergies and she denies Betadine allergies.   SOCIAL HISTORY:  The patient denies cigarette use, alcohol use, and  recreational drug use.   REVIEW OF SYSTEMS:  The patient complains of constipation and she also  complains of urinary incontinence.   FAMILY HISTORY:  The patient has a half sister with hypertension and a  half-sister with diabetes.  She has two aunts with breast cancer.   OBSTETRICAL HISTORY:  The patient has had two term vaginal deliveries.   PHYSICAL EXAM:  VITAL SIGNS:  Temperature is 98.0, oxygen saturation is  99% on room air, respirations are 20, orthostatic blood pressures and  pulses show a pulse of 82 when the patient is lying and a blood pressure  of 141/69.  Her pulse is 84 when she sits and her blood pressure is  159/64.  Her pulse is 98 when she stands  and her blood pressure is  130/73.  HEENT:  Is within normal limits.  CHEST:  Chest is clear.  HEART:  Regular rate and rhythm.  ABDOMEN:  Abdomen is nontender.  Her bowel sounds are normal.  EXTREMITIES:  Her extremities are grossly normal.  PELVIC EXAM:  External genitalia is normal.  The vagina is normal except  for a small to moderate amount of blood.  The cervix is nontender.  The  uterus is 8-10 weeks size and irregular.  Adnexa:  No masses are  appreciated.   LABORATORY VALUES:  Hemoglobin is 10.6, hematocrit is 31.8%.  White  blood cell count is 6300, platelet count is 203,000.  Sodium is 138,  potassium is 3.8, chloride is 105, carbon dioxide of 25, glucose 127,  BUN 6, creatinine 0.7, calcium 8.8, total protein is 6.6.  Albumin is  3.2.  AST is 26, ALT 19.   ASSESSMENT:  1. Heavy vaginal bleeding.  2. Breast cancer which is estrogen receptor and progesterone receptor      positive.  3. Slight increase in pulse with standing, but no orthostatic changes      with blood pressure.  4.  Anemia (hemoglobin 10.6).   PLAN:  A long discussion was held with the patient and her husband.  They understand why estrogen and progesterone therapy is suboptimal.  The patient reports that she is doing okay and she does not feel that  she needs to have a blood transfusion nor does she feel that she needs  to have IV hydration.  The patient elects to go home and simply eat  supper and drink lots.  She will call if she should feel worse.  The  patient will call the office for an appointment in the near future.  We  discussed management options which include uterine artery embolization,  hysterectomy, and endometrial ablation.  She seems to be interested in  endometrial ablation but says that she will speak with Dr. Dierdre Forth about this when she presents for her care.  1. The patient will take iron 325 mg twice each day.  She will also      take vitamin C each day.  2. The patient knows to call for questions or concerns.  Her husband      is driving and will be able to help keep an eye on her.      Janine Limbo, M.D.  Electronically Signed     AVS/MEDQ  D:  05/24/2007  T:  05/25/2007  Job:  [id number]

## 2010-09-29 NOTE — H&P (Signed)
[name], [name]            ACCOUNT NO.:  [id number]   MEDICAL RECORD NO.:  [id number]          PATIENT TYPE:  AMB   LOCATION:  SDC                           [facility]ACILITY:  [facility]   PHYSICIAN:  Hal Morales, M.D.DATE OF BIRTH:  1956/02/11   DATE OF ADMISSION:  07/20/2007  DATE OF DISCHARGE:                              HISTORY & PHYSICAL   HISTORY OF PRESENT ILLNESS:  The patient is a 55 year old black female  para 2-0-0-2 who presents for management of abnormal uterine bleeding.  The patient has a long history of oligomenorrhea interspersed with  episodes of menorrhagia.  In March 2008, she underwent an evaluation  because of her history of menorrhagia which included an endometrial  biopsy showing disordered endometrium and a pelvic ultrasound which  showed a 4 cm fibroid.  At that time, the patient was managed with  Provera with reasonably good results.  Subsequent to that, she had  fairly regular menses until October 2008, when she had an episode of  bleeding that lasted approximately 18 days.  She was again treated with  Provera and had that episode resolved.  Since that time, however, the  patient has been diagnosed with breast cancer which is estrogen and  progesterone receptor positive.  She thus has been unable to use  hormonal therapy.  She had another episode of heavy and prolonged  bleeding in January 2009, that finally resolved spontaneously.  However,  in consultation, she has made the decision that she wants to proceed  with endometrial ablation to try and minimize the risk of subsequent  episodes of heavy vaginal bleeding for which no hormonal measures can be  taken.   PAST GYNECOLOGICAL HISTORY:  1. The patient has a history of remote pelvic inflammatory disease      associated with IUD use and subsequent adhesion formation.  2. History of pelvic pain evaluated in 1994 with diagnostic      laparoscopy and lysis of adhesions with subsequent relief.  3. She had  recurrence of her pelvic pain and dyspareunia several years      later and was evaluated at [facility]      [facility].  No specific therapy was      recommended as it was expected that the patient would to go through      menopause.  That was approximately three years ago.  She continues      to have menses.  4. In March 2008, the patient was known to have an ovarian cyst with a      normal CA-125 which subsequently resolved by June 2008.   MEDICAL HISTORY:  The patient was diagnosed with left breast cancer in  September 2008 and has undergone left mastectomy and sentinel node  biopsy showing a stage I infiltrating ductal carcinoma with no sentinel  node involvement.  She currently takes tamoxifen.   SURGICAL HISTORY:  1. The patient underwent a left mastectomy in April 2008.  2. In 1997, she underwent a laparoscopic tubal cauterization.  3. In 1994, she had a diagnostic laparoscopy.   OBSTETRICAL  HISTORY:  The patient has had two normal spontaneous vaginal  deliveries, both of which was conceived spontaneously.   FAMILY HISTORY:  Positive for hypertension, diabetes and breast cancer.   SOCIAL HISTORY:  The patient is married but separated from her husband.  She lives with her youngest daughter.  The patient does not smoke  cigarettes, use alcohol or use any recreational drugs.  She works as a  Education officer, environmental.   CURRENT MEDICATION:  Tamoxifen.   DRUG SENSITIVITIES:  Originally reported as none, but she states that  she does have a sensitivity to AMOXICILLIN, causing her face to swell.  This is a history that is given only in one setting and in other  settings, she has denied any drug allergies.   REVIEW OF SYSTEMS:  Negative except as mentioned above.   PHYSICAL EXAMINATION:  GENERAL APPEARANCE:  The patient is an obese  black female in no acute distress.  VITAL SIGNS:  Temperature 98.6, blood pressure 120/80, weight 237   pounds.  LUNGS:  Clear.  HEART:  Regular rate and rhythm.  ABDOMEN:  Benign without masses or organomegaly.  PELVIC:  EGBUS within normal limits.  The vagina is rugous.  The cervix  is without gross lesions.  The uterus is upper limits of normal size,  mobile and nontender.  Adnexae no masses.   IMPRESSION:  1. Menorrhagia which is episodic, having occurred in March 2008,      October 2008 and again in January 2009.  2. Stage I left breast cancer now on tamoxifen with some risk for      endometrial hyperplasia because of that.  3. History of uterine fibroids.  4. History of chronic pelvic pain, not currently problematic.   DISPOSITION:  Several discussions have been held with the patient  concerning her menorrhagia and options for management.  She has decided  that she would like to undergo endometrial ablation.  She will have  hysteroscopy, D&C and if submucosal fibroids are noted, will have  resection of those and then endometrial ablation.  The risks of  anesthesia, bleeding, infection, damage to adjacent organs and uterine  perforation have all been explained to the patient.  She also  understands that her opportunity for success of the endometrial ablation  may be limited by a known uterine fibroid and the possibility of  adenomyosis.  She wishes to proceed.  This will be done at [facility] on July 20, 2007.      Hal Morales, M.D.  Electronically Signed     VPH/MEDQ  D:  07/16/2007  T:  07/16/2007  Job:  [id number]

## 2010-09-29 NOTE — Op Note (Signed)
[name], [name]            ACCOUNT NO.:  [id number]   MEDICAL RECORD NO.:  [id number]          PATIENT TYPE:  AMB   LOCATION:  SDC                           [facility]ACILITY:  [facility]   PHYSICIAN:  [name], M.D.DATE OF BIRTH:  06-02-1955   DATE OF PROCEDURE:  07/20/2007  DATE OF DISCHARGE:                               OPERATIVE REPORT   PREOPERATIVE DIAGNOSES:  1. Menorrhagia.  2. Uterine fibroids.  3. Breast cancer.   POSTOPERATIVE DIAGNOSES:  1. Menorrhagia.  2. Uterine fibroids.  3. Breast cancer.  4. Possible endometrial polyp.   PROCEDURE:  1. Hysteroscopy.  2. Fibroid and polyp resection.  3. Dilatation and curettage.  4. NovaSure endometrial ablation.   SURGEON:  [name], M.D.   ANESTHESIA:  General orotracheal.   ESTIMATED BLOOD LOSS:  Less than 10 mL.   COMPLICATIONS:  None.   FINDINGS:  The uterus sounded to 10 cm with a 3 cm cervical length.  At  the time of hysteroscopy, there was a less than 1 cm polypoid lesion at  the left uterocervical junction.  On the anterior endometrial surface  near the fundus, there was a 1 cm lesion which appeared to be a fibroid.  The remainder of the endometrial cavity was without lesions and appeared  slightly atrophic.   PROCEDURE:  The patient was taken to the operating room after  appropriate identification and placed on the operating table.  After the  attainment of adequate general anesthesia, she was placed in lithotomy  position.  The perineum and vagina were prepped with multiple layers of  Betadine and draped in sterile field.  The bladder was emptied with a  red Robinson catheter under sterile conditions.  A Graves speculum was  placed in the vagina and a paracervical block achieved with a total of  10 mL of 2% Xylocaine in the 5 and  7 o'clock positions.  The single-  tooth tenaculum was used to grasp the anterior cervix.  The uterus was  sounded and the cervix dilated to accommodate the Hagar  dilator and the  cervix was then measured.  The cervix was further dilated to accommodate  the diagnostic hysteroscope to a #23 dilator and the above-noted  findings were made and documented.  The small resectoscope was then used  to allow electroresection of the anterior fundal lesion and the lateral  junction lesion.  The endometrial cavity was then curetted.  Prior to  initial placement of the hysteroscope, the endocervical canal had been  curetted and those curettings taken from the operative field.  Once the  resection was complete, the endometrial cavity was rinsed with 1 liter  of lactated Ringer's.  The hysteroscope was removed and the NovaSure  endometrial ablation device placed in the endometrial cavity.  The array  was opened and seated in with an endometrial width of 3.5 cm of  achieved.  The endometrial cavity assessment was then undertaken and  passed and the NovaSure  ablation procedure begun.  It was completed in  62 seconds.  A short time was allowed for cooling and the array  retracted  into the NovaSure apparatus and the apparatus removed from the  endometrial cavity.  The single-tooth tenaculum was removed and a single  suture of 2-0 Vicryl was used to achieve hemostasis at the site of  tenaculum entry.  At that time hemostasis was noted to be adequate.  The  patient was awakened from general anesthesia and taken to the recovery  room in satisfactory condition, having tolerated the procedure well with  sponge and instrument counts correct.   SPECIMENS TO PATHOLOGY:  Endocervical curettings, endometrial curettings  and endometrial lesions.  The patient was treated with Toradol 30 mg IV  and 30 mg IM and Ancef 2 grams IV.  discharge instructions are printed  instructions from the [facility] for [facility].   DISCHARGE MEDICATIONS:  1. Ibuprofen 600 mg p.o. q.6 h p.r.n. pain,  2. Vicodin one p.o. q.4 h p.r.n. pain.   The patient is to follow up in 2 weeks with Dr.  Pennie Rushing.      [name], M.D.  Electronically Signed     VPH/MEDQ  D:  07/20/2007  T:  07/20/2007  Job:  [id number]

## 2011-02-04 LAB — COMPREHENSIVE METABOLIC PANEL
Alkaline Phosphatase: 48
BUN: 6
Creatinine, Ser: 0.7
Glucose, Bld: 127 — ABNORMAL HIGH
Potassium: 3.8
Total Protein: 6.6

## 2011-02-04 LAB — CBC
HCT: 31.8 — ABNORMAL LOW
Hemoglobin: 10.6 — ABNORMAL LOW
MCHC: 33.3
MCV: 84.6
RDW: 14.6

## 2011-02-08 LAB — CBC
MCV: 83.4
RBC: 4.26
WBC: 4.9

## 2011-07-20 ENCOUNTER — Ambulatory Visit: Payer: Self-pay | Admitting: Obstetrics and Gynecology

## 2011-08-07 ENCOUNTER — Ambulatory Visit (INDEPENDENT_AMBULATORY_CARE_PROVIDER_SITE_OTHER): Payer: BC Managed Care – PPO | Admitting: Family Medicine

## 2011-08-07 VITALS — BP 152/80 | HR 89 | Temp 98.7°F | Resp 18 | Ht 66.0 in | Wt 248.0 lb

## 2011-08-07 DIAGNOSIS — J40 Bronchitis, not specified as acute or chronic: Secondary | ICD-10-CM

## 2011-08-07 DIAGNOSIS — J159 Unspecified bacterial pneumonia: Secondary | ICD-10-CM

## 2011-08-07 DIAGNOSIS — C50919 Malignant neoplasm of unspecified site of unspecified female breast: Secondary | ICD-10-CM | POA: Insufficient documentation

## 2011-08-07 DIAGNOSIS — J209 Acute bronchitis, unspecified: Secondary | ICD-10-CM

## 2011-08-07 MED ORDER — AZITHROMYCIN 250 MG PO TABS: ORAL_TABLET | ORAL | Status: AC

## 2011-08-07 MED ORDER — HYDROCODONE-HOMATROPINE 5-1.5 MG/5ML PO SYRP: 5.0000 mL | ORAL_SOLUTION | Freq: Three times a day (TID) | ORAL | Status: DC | PRN

## 2011-08-07 NOTE — Patient Instructions (Signed)

## 2011-08-07 NOTE — Progress Notes (Signed)
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  No shortness of breath.  Worsening sx.  O:  NAD HEENT:  Unremarkable Neck: supple, no adenop Chest:  Bibasilar rales, bilateral exp wheezes. Skin: normal color  A:  Acute bronchitis with clinical features of early pneumonia, no resp decompensation  P:  hydromet

## 2011-08-13 ENCOUNTER — Telehealth: Payer: Self-pay

## 2011-08-13 DIAGNOSIS — J40 Bronchitis, not specified as acute or chronic: Secondary | ICD-10-CM

## 2011-08-13 MED ORDER — HYDROCODONE-HOMATROPINE 5-1.5 MG/5ML PO SYRP: 5.0000 mL | ORAL_SOLUTION | Freq: Three times a day (TID) | ORAL | Status: AC | PRN

## 2011-08-13 NOTE — Telephone Encounter (Signed)
Patient requesting refill of cough syrup and antibiotics. Pt feels some better but is still coughing.

## 2011-08-13 NOTE — Telephone Encounter (Signed)
Refill of Hycodan done.   [name]

## 2011-08-13 NOTE — Telephone Encounter (Signed)
Called patient, n/a.  Will call in cough syrup to pharmacy.

## 2011-08-13 NOTE — Telephone Encounter (Signed)
Is it ok to refill Hycodan rx'd 3/24?  Patients Zpak should still be in system and working.

## 2011-08-16 ENCOUNTER — Telehealth: Payer: Self-pay | Admitting: Oncology

## 2011-08-16 NOTE — Telephone Encounter (Signed)
S/w pt re appts for 4/15 and 4/22

## 2011-08-17 ENCOUNTER — Other Ambulatory Visit: Payer: Self-pay | Admitting: Obstetrics and Gynecology

## 2011-08-17 ENCOUNTER — Ambulatory Visit (INDEPENDENT_AMBULATORY_CARE_PROVIDER_SITE_OTHER): Payer: BC Managed Care – PPO | Admitting: Obstetrics and Gynecology

## 2011-08-17 DIAGNOSIS — Z01419 Encounter for gynecological examination (general) (routine) without abnormal findings: Secondary | ICD-10-CM

## 2011-08-17 DIAGNOSIS — R350 Frequency of micturition: Secondary | ICD-10-CM

## 2011-08-17 DIAGNOSIS — Z9010 Acquired absence of unspecified breast and nipple: Secondary | ICD-10-CM

## 2011-08-17 DIAGNOSIS — Z124 Encounter for screening for malignant neoplasm of cervix: Secondary | ICD-10-CM

## 2011-08-17 DIAGNOSIS — N644 Mastodynia: Secondary | ICD-10-CM

## 2011-08-18 ENCOUNTER — Other Ambulatory Visit: Payer: Self-pay

## 2011-08-18 DIAGNOSIS — R102 Pelvic and perineal pain: Secondary | ICD-10-CM

## 2011-08-19 ENCOUNTER — Ambulatory Visit: Payer: Self-pay | Admitting: Obstetrics and Gynecology

## 2011-08-30 ENCOUNTER — Other Ambulatory Visit: Payer: BC Managed Care – PPO

## 2011-08-31 ENCOUNTER — Telehealth: Payer: Self-pay | Admitting: Obstetrics and Gynecology

## 2011-08-31 ENCOUNTER — Other Ambulatory Visit (HOSPITAL_BASED_OUTPATIENT_CLINIC_OR_DEPARTMENT_OTHER): Payer: BC Managed Care – PPO | Admitting: Lab

## 2011-08-31 DIAGNOSIS — C50419 Malignant neoplasm of upper-outer quadrant of unspecified female breast: Secondary | ICD-10-CM

## 2011-08-31 LAB — CBC WITH DIFFERENTIAL/PLATELET
BASO%: 0.4 % (ref 0.0–2.0)
EOS%: 1.7 % (ref 0.0–7.0)
HCT: 34.2 % — ABNORMAL LOW (ref 34.8–46.6)
LYMPH%: 52.1 % — ABNORMAL HIGH (ref 14.0–49.7)
MCH: 27.3 pg (ref 25.1–34.0)
MCHC: 32.2 g/dL (ref 31.5–36.0)
MCV: 85 fL (ref 79.5–101.0)
MONO%: 6.5 % (ref 0.0–14.0)
NEUT%: 39.3 % (ref 38.4–76.8)
Platelets: 158 10*3/uL (ref 145–400)

## 2011-08-31 LAB — COMPREHENSIVE METABOLIC PANEL
AST: 19 U/L (ref 0–37)
Alkaline Phosphatase: 67 U/L (ref 39–117)
BUN: 9 mg/dL (ref 6–23)
Creatinine, Ser: 0.6 mg/dL (ref 0.50–1.10)
Glucose, Bld: 123 mg/dL — ABNORMAL HIGH (ref 70–99)

## 2011-09-01 ENCOUNTER — Telehealth: Payer: Self-pay

## 2011-09-01 LAB — VITAMIN D 25 HYDROXY (VIT D DEFICIENCY, FRACTURES): Vit D, 25-Hydroxy: 36 ng/mL (ref 30–89)

## 2011-09-01 NOTE — Telephone Encounter (Signed)
PT TO PT. UNABLE TO LM ON VM DUE TO FULL MAILBOX. PT LEFT MESSAGE ON VM RGDG REFERRAL APPT AND U/S APPT.

## 2011-09-02 NOTE — Telephone Encounter (Signed)
TC FROM PT. PT TOLD APPT SCHED 09/13/11 @4 :00P WITH [facility] FOR U/S AND FU. APPT SCHED 10/04/11 @9 :45A WITH [facility]. PT VOICES UNDERSTANDING.

## 2011-09-06 ENCOUNTER — Ambulatory Visit (HOSPITAL_BASED_OUTPATIENT_CLINIC_OR_DEPARTMENT_OTHER): Payer: BC Managed Care – PPO | Admitting: Oncology

## 2011-09-06 VITALS — BP 149/80 | HR 80 | Temp 98.2°F | Ht 66.0 in | Wt 245.7 lb

## 2011-09-06 DIAGNOSIS — C50919 Malignant neoplasm of unspecified site of unspecified female breast: Secondary | ICD-10-CM

## 2011-09-06 DIAGNOSIS — Z170 Estrogen receptor positive status [ER+]: Secondary | ICD-10-CM

## 2011-09-06 DIAGNOSIS — Z9010 Acquired absence of unspecified breast and nipple: Secondary | ICD-10-CM

## 2011-09-06 MED ORDER — TAMOXIFEN CITRATE 20 MG PO TABS: 20.0000 mg | ORAL_TABLET | Freq: Every day | ORAL | Status: DC

## 2011-09-06 MED ORDER — MEGESTROL ACETATE 20 MG PO TABS: 20.0000 mg | ORAL_TABLET | Freq: Every day | ORAL | Status: AC

## 2011-09-06 NOTE — Progress Notes (Signed)
ID: [name]   DOB: [birth year]  MR#: [id number]  JXB#:[id number]  HISTORY OF PRESENT ILLNESS: The patient had a screening mammogram May 18, 2006 at the [facility], which really showed no suspicious findings, but since she was having some pain in the left breast an ultrasound was obtained on January 17.  The area where she was having pain turned out to be unremarkable, but it uncovered a separate area measuring about 4 mm with some spiculation, as well as a second even smaller vague hypoechoic area.  Both of these were suspicious.  The axilla was negative.  With this information, the patient had an ultrasound-guided core biopsy of the larger lesion on June 08, 2006.  This showed an invasive breast cancer, which was initially felt likely to be lobular.  It was strongly ER and PR positive at 96% and 99% with a low proliferation marker at 5%, HER-2/neu equivocal at 2+, but negative by [facility] with a ration of 1.26.  Bilateral breast MRIs were obtained June 24, 2006.  This actually showed 3 different areas of abnormal in the left breast, in different quadrants.  One, of course, was the one that had been previously biopsied. One of the other 2 areas was biopsied under MRI guidance on July 08, 2006, and this showed ([id number] and [id number]) again a strongly ER and PR positive infiltrating breast cancer, which was read initially as a ductal carcinoma, the actual numbers being 97% positivity for the ER and 100% for the PR.  The proliferation marker here was higher at 18%.  The HercepTest was lower at 0.    With this information, after appropriate discussion, Dr. Lurene Shadow proceeded to left mastectomy and sentinel lymph node sampling on August 19, 2006.  The final pathology there ([id number]) showed at least 3 areas of invasive carcinoma, as well as some areas of lobular carcinoma in situ.  The largest invasive component measured 1.7 cm, it was ductal, margins were ample, the tumor grade was 2, and both  lymph nodes were negative. Subsequent treatment is as detailed below.  INTERVAL HISTORY: [name] returns today for followup of her breast cancer. Interval history is unremarkable. Family is doing well. 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  REVIEW OF SYSTEMS: She has been having some cramps, left leg more than the right, worse at night. This improves with activity. She is being increasing her electrolytes and fluids and that is helping as well. She is having more hot flashes at night despite the gabapentin, and this is making her quite fatigued. Of course her job is very demanding. She has some urinary dribbling, and it is a problem that she can't get to the bathroom for 5 hours a time during the day because she has noted and no substitute out to help her out in class. Occasionally she has ankle swelling bilaterally, she has some shortness of breath when walking up stairs and admits that she is very deconditioned and not currently exercising. She has occasional low back pain, occasional headaches, feels forgetful and anxious. Aside from the cramps, the nighttime hot flashes is what is bothering her the most. A detailed review of systems was otherwise noncontributory  PAST MEDICAL HISTORY: Significant for bladder incontinence, which has been evaluated by Dr. Logan Bores, history of migraines, which have been evaluated by Dr. Sandria Manly, history of hypercholesterolemia, history of borderline diabetes, history of obesity, history of GERD, history of what sounds like fibroids in her uterus and history of  tobacco abuse with a 9-pack-year history, the patient quitting in 1980.    FAMILY HISTORY The patient's father lives in [place] and she has very little information about him.  The patient's mother, also from [place], is 58 years old.  The patient's mother is 1 of 10 children, including 9 sisters.  Among the patient's mother's 8 sisters, 2 had breast cancer, both diagnosed in their  early 101s, one has died, one survives.  The patient herself has multiple half-brothers and half-sisters, but no one with breast or ovarian cancer to the best of her knowledge.  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  Her husband Loraine Leriche teaches math at [employer].  Their daughter Thomasenia Sales, is Tour manager in [place].  The patient's daughter Towanda Malkin is at home.  The patient is a Control and instrumentation engineer.   ADVANCED DIRECTIVES: not in place  HEALTH MAINTENANCE: History  Substance Use Topics  . Smoking status: Former Smoker    Quit date: 06/08/1981  . Smokeless tobacco: Not on file  . Alcohol Use: Not on file     Colonoscopy: due  PAP: UTD  Bone density: November 2009/ normal  Lipid panel:  Allergies  Allergen Reactions  . Multihance (Gadobenate Dimeglumine) Hives, Itching and Nausea Only    Pt had breast mri with multihance, was at first nauseated, clammy and dizzy. Then after about 15 minutes had rash,itching and hives. Treated with 50mg  of benadryl at office and seen by dr. Alfredo Batty  . Amoxicillin (Amoxil) Swelling    Current Outpatient Prescriptions  Medication Sig Dispense Refill  . B Complex-C (SUPER B COMPLEX PO) Take 1 tablet by mouth daily.      . fish oil-omega-3 fatty acids 1000 MG capsule Take 2 g by mouth daily.      Marland Kitchen gabapentin (NEURONTIN) 600 MG tablet Take 600 mg by mouth Nightly.      . tamoxifen (NOLVADEX) 20 MG tablet Take 20 mg by mouth daily.      Marland Kitchen VITAMIN D, CHOLECALCIFEROL, PO Take 2,000 Units by mouth.      . vitamin E (VITAMIN E) 400 UNIT capsule Take 400 Units by mouth daily.        OBJECTIVE: middle-aged African American woman in no acute distress Filed Vitals:   09/06/11 1701  BP: 149/80  Pulse: 80  Temp: 98.2 F (36.8 C)     Body mass index is 39.66 kg/(m^2).    ECOG FS: 0  Sclerae unicteric Oropharynx clear No peripheral  adenopathy Lungs no rales or rhonchi Heart regular rate and rhythm Abd benign MSK no focal spinal tenderness, no peripheral edema Neuro: nonfocal Breasts: the right breast is unremarkable; the left breast is status post mastectomy; there is no evidence of local recurrence  LAB RESULTS: Lab Results  Component Value Date   WBC 6.7 08/31/2011   NEUTROABS 2.6 08/31/2011   HGB 11.0* 08/31/2011   HCT 34.2* 08/31/2011   MCV 85.0 08/31/2011   PLT 158 08/31/2011      Chemistry      Component Value Date/Time   NA 139 08/31/2011 1629   K 3.8 08/31/2011 1629   CL 103 08/31/2011 1629   CO2 27 08/31/2011 1629   BUN 9 08/31/2011 1629   CREATININE 0.60 08/31/2011 1629      Component Value Date/Time   CALCIUM 9.4 08/31/2011 1629   ALKPHOS 67 08/31/2011 1629   AST  19 08/31/2011 1629   ALT 11 08/31/2011 1629   BILITOT 0.2* 08/31/2011 1629       Lab Results  Component Value Date   LABCA2 16 08/31/2011    No components found with this basename: [id number]    No results found for this basename: INR:1;PROTIME:1 in the last 168 hours  Urinalysis No results found for this basename: colorurine, appearanceur, labspec, phurine, glucoseu, hgbur, bilirubinur, ketonesur, proteinur, urobilinogen, nitrite, leukocytesur    STUDIES: Most recent mammography and breast MRI a year ago. These are now again due. She tells me mammogram has already been scheduled for next week  ASSESSMENT: 56 year old [place] woman status post left mastectomy and sentinel lymph node dissection April 2008 for a T1cN0, stage IA, invasive ductal carcinoma, grade 2, strongly estrogen and progesterone receptor positive, HER-2 negative, with a low proliferation fraction and a low Oncotype DX score.  She has been on tamoxifen since April 2008 with good tolerance.  PLAN: she has completed 5 years of tamoxifen and we could either stop now, continue tamoxifen for an additional 5 years, or switch to an aromatase inhibitor for an additional 5  years. Her original Oncotype results predicted a 9% risk of distant recurrence within 10 years if she took tamoxifen for 5 years, which she has done. This means that benefit from another 5 years of antiestrogen therapy likely would be in the 2-3% range.  This is a motivator for her. She is not having any side effects from the tamoxifen that she is aware of and it is inexpensive. She would prefer to continue on tamoxifen no evidence which to aromatase inhibitor because of concerns regarding side effects including a bone density loss. Accordingly the plan is to continue tamoxifen for an additional 5 years, with yearly visits uncle she completes her treatment.  The gabapentin is no longer working for her at bedtime. I think she would do better with megestrol 20 mg at bedtime so we stopped the gabapentin and added the Megace trouble. She will let us know if that does not work for her. She may want to cut the tablet in half if she finds it 20 mg altogether eliminates the hot flashes. Otherwise she will call for any problems that may develop before her return visit next year. 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

## 2011-09-13 ENCOUNTER — Other Ambulatory Visit: Payer: BC Managed Care – PPO

## 2011-09-13 ENCOUNTER — Ambulatory Visit
Admission: RE | Admit: 2011-09-13 | Discharge: 2011-09-13 | Disposition: A | Discharge status: Home / Self Care | Payer: BC Managed Care – PPO | Source: Ambulatory Visit | Attending: Obstetrics and Gynecology | Admitting: Obstetrics and Gynecology

## 2011-09-13 ENCOUNTER — Encounter: Payer: BC Managed Care – PPO | Admitting: Obstetrics and Gynecology

## 2011-09-13 DIAGNOSIS — Z9010 Acquired absence of unspecified breast and nipple: Secondary | ICD-10-CM

## 2011-09-13 DIAGNOSIS — N644 Mastodynia: Secondary | ICD-10-CM

## 2011-09-23 ENCOUNTER — Encounter: Payer: Self-pay | Admitting: Obstetrics and Gynecology

## 2011-09-23 DIAGNOSIS — N731 Chronic parametritis and pelvic cellulitis: Secondary | ICD-10-CM | POA: Insufficient documentation

## 2011-09-23 DIAGNOSIS — C50912 Malignant neoplasm of unspecified site of left female breast: Secondary | ICD-10-CM | POA: Insufficient documentation

## 2011-09-23 DIAGNOSIS — K589 Irritable bowel syndrome without diarrhea: Secondary | ICD-10-CM

## 2011-09-23 DIAGNOSIS — N736 Female pelvic peritoneal adhesions (postinfective): Secondary | ICD-10-CM | POA: Insufficient documentation

## 2011-09-24 ENCOUNTER — Encounter: Payer: Self-pay | Admitting: Obstetrics and Gynecology

## 2011-09-27 ENCOUNTER — Ambulatory Visit (INDEPENDENT_AMBULATORY_CARE_PROVIDER_SITE_OTHER): Payer: BC Managed Care – PPO | Admitting: Obstetrics and Gynecology

## 2011-09-27 ENCOUNTER — Other Ambulatory Visit: Payer: Self-pay | Admitting: Obstetrics and Gynecology

## 2011-09-27 ENCOUNTER — Ambulatory Visit (INDEPENDENT_AMBULATORY_CARE_PROVIDER_SITE_OTHER): Payer: BC Managed Care – PPO

## 2011-09-27 VITALS — BP 130/70 | Ht 65.0 in | Wt 246.0 lb

## 2011-09-27 VITALS — BP 130/70 | Ht 66.0 in | Wt 246.0 lb

## 2011-09-27 DIAGNOSIS — N949 Unspecified condition associated with female genital organs and menstrual cycle: Secondary | ICD-10-CM

## 2011-09-27 DIAGNOSIS — R102 Pelvic and perineal pain: Secondary | ICD-10-CM

## 2011-09-27 DIAGNOSIS — Z9889 Other specified postprocedural states: Secondary | ICD-10-CM

## 2011-09-27 DIAGNOSIS — D259 Leiomyoma of uterus, unspecified: Secondary | ICD-10-CM

## 2011-09-27 DIAGNOSIS — N731 Chronic parametritis and pelvic cellulitis: Secondary | ICD-10-CM

## 2011-09-27 DIAGNOSIS — C50912 Malignant neoplasm of unspecified site of left female breast: Secondary | ICD-10-CM

## 2011-09-27 DIAGNOSIS — D219 Benign neoplasm of connective and other soft tissue, unspecified: Secondary | ICD-10-CM

## 2011-09-27 DIAGNOSIS — C50919 Malignant neoplasm of unspecified site of unspecified female breast: Secondary | ICD-10-CM

## 2011-09-27 NOTE — Progress Notes (Unsigned)
Subjective:     [name] is a 56 y.o. female G2P2002 who presents with complaints of chronic pelvic pain and abdominal/pelvic pain.   Onset of symptoms was gradual starting many years ago and is waxing and waning.The pain is located in the lower abdomen and in the low pelvis and is described as cramping, dull and pressure-like with an intensity of 4 on a 10 point pain scale. The pain does not radiate. The pain occurs unpredictably  In the past, she has  undergone treatment with  laparoscopy.  She does not desire further childbearing  Associated symptoms: none Family history: non contributory .  Pertinent gyn history:   Menses: s/p endometrial ablation  PMH: Past Medical History  Diagnosis Date  . Abnormal Pap smear 05/25/1991  . Depression 07/1991  . Headache   . Varicose veins     Mother  . Anemia   . Bladder infection      Medication: (Not in a hospital admission) Allergies: Allergies  Allergen Reactions  . Multihance (Gadobenate Dimeglumine) Hives, Itching and Nausea Only    Pt had breast mri with multihance, was at first nauseated, clammy and dizzy. Then after about 15 minutes had rash,itching and hives. Treated with 50mg  of benadryl at office and seen by dr. Alfredo Batty  . Amoxicillin (Amoxicillin) Swelling      Review of systems:   Pertinent items are noted in HPI.  Objective:    BP 130/70  Ht 5\' 6"  (1.676 m)  Wt 246 lb (111.585 kg)  BMI 39.71 kg/m2  Weight: Wt Readings from Last 1 Encounters:  09/27/11 246 lb (111.585 kg)    BMI: Body mass index is 39.71 kg/(m^2).  General Appearance: Alert, appropriate appearance for age. No acute distress. Morbidly obese  Ultrasound: the uterus measures 6.58 x 10.8 x 6.83 cm. Transabdominal and transvaginal images of the pelvis did not allow visualization of the endometrium. The uterus was enlarged with an anterior right fibroid measuring 3.6 x 4.3 x 4 cm. There were no adnexal masses though the ovaries could not be  visualized. There was no free fluid.        Assessment and Plan:   Chronic pelvic pain now in the setting of endometrial ablation and tamoxifen therapy without clear ultrasound delineation of the endometrium Recommendation: next noninvasive step in delineation of the pelvic anatomy would be pelvic MRI with contrast.  The suggestion has been made in the past the patient may have adenomyosis. She has known fibroids documented on several ultrasounds. Delineation of the endometrial lining would allow [place] to rule out mass which may be associated with tamoxifen therapy. The patient accepts this recommendation  Daivik Overley P  MD 5/13/20138:20 PM  This encounter was created in error - please disregard.

## 2011-09-30 ENCOUNTER — Encounter: Payer: Self-pay | Admitting: Obstetrics and Gynecology

## 2011-09-30 NOTE — Progress Notes (Signed)
   [name] is a 56 y.o. female G2P2002 who presents with complaints of chronic pelvic pain and abdominal/pelvic pain.   Onset of symptoms was gradual starting many years ago and is waxing and waning.The pain is located in the lower abdomen and in the low pelvis and is described as cramping, dull and pressure-like with an intensity of 4 on a 10 point pain scale. The pain does not radiate. The pain occurs unpredictably  In the past, she has  undergone treatment with  laparoscopy.  She does not desire further childbearing  Associated symptoms: none Family history: non contributory .  Pertinent gyn history:  Menses: s/p endometrial ablation  PMH: Past Medical History Diagnosis Date . Abnormal Pap smear 05/25/1991 . Depression 07/1991 . Headache  . Varicose veins    Mother . Anemia  . Bladder infection     Medication: (Not in a hospital admission) Allergies: Allergies Allergen Reactions . Multihance (Gadobenate Dimeglumine) Hives, Itching and Nausea Only   Pt had breast mri with multihance, was at first nauseated, clammy and dizzy. Then after about 15 minutes had rash,itching and hives. Treated with 50mg  of benadryl at office and seen by dr. Alfredo Batty . Amoxicillin (Amoxicillin) Swelling     Review of systems:  Pertinent items are noted in HPI.  Objective:   BP 130/70  Ht 5\' 6"  (1.676 m)  Wt 246 lb (111.585 kg)  BMI 39.71 kg/m2  Weight: Wt Readings from Last 1 Encounters: 09/27/11 246 lb (111.585 kg)   BMI: Body mass index is 39.71 kg/(m^2).  General Appearance: Alert, appropriate appearance for age. No acute distress. Morbidly obese  Ultrasound: the uterus measures 6.58 x 10.8 x 6.83 cm. Transabdominal and transvaginal images of the pelvis did not allow visualization of the endometrium. The uterus was enlarged with an anterior right fibroid measuring 3.6 x 4.3 x 4 cm. There were no adnexal masses though the ovaries could not be visualized. There was no free  fluid.        Assessment and Plan:  Chronic pelvic pain now in the setting of endometrial ablation and tamoxifen therapy without clear ultrasound delineation of the endometrium Recommendation: next noninvasive step in delineation of the pelvic anatomy would be pelvic MRI with contrast.  The suggestion has been made in the past the patient may have adenomyosis. She has known fibroids documented on several ultrasounds. Delineation of the endometrial lining would allow [place] to rule out mass which may be associated with tamoxifen therapy. The patient accepts this recommendation  Zolton Dowson P  MD 5/13/20138:20 PM

## 2011-11-01 ENCOUNTER — Encounter: Payer: BC Managed Care – PPO | Admitting: Nutrition

## 2011-11-01 ENCOUNTER — Encounter: Payer: Self-pay | Admitting: Nutrition

## 2011-11-01 NOTE — Progress Notes (Signed)
Patient did not show up for nutrition appointment scheduled 11-01-11.

## 2012-03-09 ENCOUNTER — Telehealth: Payer: Self-pay | Admitting: Obstetrics and Gynecology

## 2012-03-10 NOTE — Telephone Encounter (Signed)
Triage please sched. There was an order placed for MRI on  09-27-11 for this to be done. Please also make vph aware.

## 2012-03-10 NOTE — Telephone Encounter (Signed)
[facility] pt 

## 2012-03-10 NOTE — Telephone Encounter (Signed)
Please see msg

## 2012-03-17 NOTE — Telephone Encounter (Signed)
Kim please address.

## 2012-03-17 NOTE — Telephone Encounter (Signed)
Transferred to benefits coordinator for verification rgdg MRI.

## 2012-03-20 ENCOUNTER — Ambulatory Visit
Admission: RE | Admit: 2012-03-20 | Discharge: 2012-03-20 | Disposition: A | Discharge status: Home / Self Care | Payer: BC Managed Care – PPO | Source: Ambulatory Visit | Attending: Obstetrics and Gynecology | Admitting: Obstetrics and Gynecology

## 2012-03-20 ENCOUNTER — Inpatient Hospital Stay: Admit: 2012-03-20 | Payer: BC Managed Care – PPO

## 2012-03-20 ENCOUNTER — Emergency Department (HOSPITAL_COMMUNITY)
Admission: EM | Admit: 2012-03-20 | Discharge: 2012-03-20 | Disposition: A | Discharge status: Home / Self Care | Payer: BC Managed Care – PPO | Attending: Emergency Medicine | Admitting: Emergency Medicine

## 2012-03-20 ENCOUNTER — Other Ambulatory Visit: Payer: Self-pay | Admitting: Obstetrics and Gynecology

## 2012-03-20 DIAGNOSIS — Z87448 Personal history of other diseases of urinary system: Secondary | ICD-10-CM | POA: Insufficient documentation

## 2012-03-20 DIAGNOSIS — F329 Major depressive disorder, single episode, unspecified: Secondary | ICD-10-CM | POA: Insufficient documentation

## 2012-03-20 DIAGNOSIS — Z87891 Personal history of nicotine dependence: Secondary | ICD-10-CM | POA: Insufficient documentation

## 2012-03-20 DIAGNOSIS — R102 Pelvic and perineal pain: Secondary | ICD-10-CM

## 2012-03-20 DIAGNOSIS — Z8669 Personal history of other diseases of the nervous system and sense organs: Secondary | ICD-10-CM | POA: Insufficient documentation

## 2012-03-20 DIAGNOSIS — D259 Leiomyoma of uterus, unspecified: Secondary | ICD-10-CM

## 2012-03-20 DIAGNOSIS — Z79899 Other long term (current) drug therapy: Secondary | ICD-10-CM | POA: Insufficient documentation

## 2012-03-20 DIAGNOSIS — F3289 Other specified depressive episodes: Secondary | ICD-10-CM | POA: Insufficient documentation

## 2012-03-20 DIAGNOSIS — Z862 Personal history of diseases of the blood and blood-forming organs and certain disorders involving the immune mechanism: Secondary | ICD-10-CM | POA: Insufficient documentation

## 2012-03-20 DIAGNOSIS — Z9889 Other specified postprocedural states: Secondary | ICD-10-CM

## 2012-03-20 DIAGNOSIS — N731 Chronic parametritis and pelvic cellulitis: Secondary | ICD-10-CM

## 2012-03-20 DIAGNOSIS — I8390 Asymptomatic varicose veins of unspecified lower extremity: Secondary | ICD-10-CM | POA: Insufficient documentation

## 2012-03-20 DIAGNOSIS — D219 Benign neoplasm of connective and other soft tissue, unspecified: Secondary | ICD-10-CM

## 2012-03-20 DIAGNOSIS — T50905A Adverse effect of unspecified drugs, medicaments and biological substances, initial encounter: Secondary | ICD-10-CM

## 2012-03-20 DIAGNOSIS — T50995A Adverse effect of other drugs, medicaments and biological substances, initial encounter: Secondary | ICD-10-CM | POA: Insufficient documentation

## 2012-03-20 LAB — POCT I-STAT, CHEM 8
Creatinine, Ser: 0.9 mg/dL (ref 0.50–1.10)
Glucose, Bld: 156 mg/dL — ABNORMAL HIGH (ref 70–99)
Hemoglobin: 13.3 g/dL (ref 12.0–15.0)
Sodium: 142 mEq/L (ref 135–145)
TCO2: 23 mmol/L (ref 0–100)

## 2012-03-20 MED ORDER — GADOBENATE DIMEGLUMINE 529 MG/ML IV SOLN: 20.0000 mL | Freq: Once | INTRAVENOUS | Status: AC | PRN

## 2012-03-20 MED ORDER — GADOBENATE DIMEGLUMINE 529 MG/ML IV SOLN
20.0000 mL | Freq: Once | INTRAVENOUS | Status: AC | PRN
  Administered 2012-03-20: 20 mL via INTRAVENOUS

## 2012-03-20 MED ORDER — DIPHENHYDRAMINE HCL 25 MG PO TABS: 25.0000 mg | ORAL_TABLET | Freq: Four times a day (QID) | ORAL | Status: DC | PRN

## 2012-03-20 NOTE — ED Notes (Addendum)
PT had an All[facility] Pt reported SHOB ,itching. Pt was given .5mg  of benadryl Iv .ems GAVE PEpSID AND epi  IM was given. Marland Kitchen PT A/O on arrival to ED but feels sleepy. Air way  Clear O@ sats 96 on 2 liters.

## 2012-03-20 NOTE — ED Provider Notes (Signed)
History     CSN: [id number] Arrival date & time 03/20/12  1659 First MD Initiated Contact with Patient 03/20/12 1722     Chief Complaint  Patient presents with  . Allergic Reaction   HPI Pt was having an MRI with contrast today.  Pt previously had a reaction to contrast dye.  5 minutes after she felt the room get dark and she felt an impending sense of doom.  She developed pain in her leg as well as burning.  She felt short of breath.  No rash but she felt itchy.  Pt was given benadryl and epinephrine as well as pepsid.  She is feeling a little lightheaded but a lot better than earlier.  She was getting the MRI to evaluate abdominal pain issues.  Past Medical History  Diagnosis Date  . Abnormal Pap smear 05/25/1991  . Depression 07/1991  . Headache   . Varicose veins     Mother  . Anemia   . Bladder infection     Past Surgical History  Procedure Date  . Lysis of adhesions Resolved 10/26/90    For pelvic adhesions  . Colposcopy 06/1991  . Tubal ligation     No family history on file.  History  Substance Use Topics  . Smoking status: Former Smoker    Quit date: 06/08/1981  . Smokeless tobacco: Not on file  . Alcohol Use: Not on file    OB History    Grav Para Term Preterm Abortions TAB SAB Ect Mult Living   2 2 2  0 0 0 0 0 0 2      Review of Systems  All other systems reviewed and are negative.    Allergies  Multihance; Amoxicillin; and Kiwi extract  Home Medications   Current Outpatient Rx  Name  Route  Sig  Dispense  Refill  . OMEGA-3 FATTY ACIDS 1000 MG PO CAPS   Oral   Take 2 g by mouth daily.         Marland Kitchen MELATONIN PO   Oral   Take 1 tablet by mouth at bedtime.         . ADULT MULTIVITAMIN W/MINERALS CH   Oral   Take 1 tablet by mouth daily.         Marland Kitchen TAMOXIFEN CITRATE 20 MG PO TABS   Oral   Take 1 tablet (20 mg total) by mouth daily.   90 tablet   12   . VITAMIN D (CHOLECALCIFEROL) PO   Oral   Take 2,000 Units by mouth.         Marland Kitchen  VITAMIN E 400 UNITS PO CAPS   Oral   Take 400 Units by mouth daily.           BP 134/63  Pulse 92  Temp 97.5 F (36.4 C) (Oral)  Resp 14  SpO2 97%  Physical Exam  Nursing note and vitals reviewed. Constitutional: She appears well-developed and well-nourished. No distress.  HENT:  Head: Normocephalic and atraumatic.  Right Ear: External ear normal.  Left Ear: External ear normal.  Eyes: Conjunctivae normal are normal. Right eye exhibits no discharge. Left eye exhibits no discharge. No scleral icterus.  Neck: Neck supple. No tracheal deviation present.  Cardiovascular: Normal rate, regular rhythm and intact distal pulses.   Pulmonary/Chest: Effort normal and breath sounds normal. No stridor. No respiratory distress. She has no wheezes. She has no rales.  Abdominal: Soft. Bowel sounds are normal. She exhibits no distension. There is  no tenderness. There is no rebound and no guarding.  Musculoskeletal: She exhibits no edema and no tenderness.  Neurological: She is alert. She has normal strength. No sensory deficit. Cranial nerve deficit:  no gross defecits noted. She exhibits normal muscle tone. She displays no seizure activity. Coordination normal.  Skin: Skin is warm and dry. No rash noted.  Psychiatric: She has a normal mood and affect.    ED Course  Procedures (including critical care time)  Labs Reviewed  POCT I-STAT, CHEM 8 - Abnormal; Notable for the following:    Glucose, Bld 156 (*)     Calcium, Ion 1.31 (*)     All other components within normal limits   No results found.     MDM  The patient has been monitored in the emergency department. She has had no difficulty breathing. Her symptoms have not increased and have improved somewhat. Patient had a reaction to the MRI dye although this was not clearly anaphylactic.  At this time there does not appear to be any evidence of an acute emergency medical condition and the patient appears stable for discharge with  appropriate outpatient follow up. I will have her continue antihistamines with exudate        Celene Kras, MD 03/20/12 1943

## 2012-03-21 ENCOUNTER — Other Ambulatory Visit: Payer: Self-pay | Admitting: Obstetrics and Gynecology

## 2012-03-21 NOTE — Progress Notes (Addendum)
On 03-20-2012 this pt had an allergic reaction to MR contrast and was given epinephrine 0.5 mg sub-q and benadryl 50 mg IV by Dr. Bonnielee Haff. Pt was transported to ER by EMS.

## 2012-03-22 ENCOUNTER — Telehealth: Payer: Self-pay | Admitting: Obstetrics and Gynecology

## 2012-03-22 NOTE — Telephone Encounter (Signed)
vph  

## 2012-03-22 NOTE — Telephone Encounter (Signed)
Tc to pt per telephone call. Pt wants to let [facility] know that she had a severe allergic reaction to the contrast dye from her MRI on 03/20/12. Pt has SOB, throat swelling, and a burning sensation all over inside of body. Pt went to ER from [facility] for eval. Pt states,"doing fine now". Informed pt will make [facility] aware. Contrast dye added to pt's allergy list. Pt agrees.

## 2012-03-22 NOTE — Telephone Encounter (Signed)
New allergy noted

## 2012-03-23 ENCOUNTER — Telehealth: Payer: Self-pay

## 2012-03-23 NOTE — Telephone Encounter (Signed)
Message copied by Raylene Everts on Thu Mar 23, 2012  9:59 AM ------      Message from: Dierdre Forth P      Created: Wed Mar 22, 2012  9:43 PM       Needs followup to review MRI

## 2012-03-23 NOTE — Telephone Encounter (Signed)
Tc to pt per [facility] recs. Appt sched 04/03/12 @4 :15 to review. Pt agrees.

## 2012-03-28 DIAGNOSIS — C50912 Malignant neoplasm of unspecified site of left female breast: Secondary | ICD-10-CM | POA: Insufficient documentation

## 2012-03-28 DIAGNOSIS — N83209 Unspecified ovarian cyst, unspecified side: Secondary | ICD-10-CM | POA: Insufficient documentation

## 2012-03-28 DIAGNOSIS — D219 Benign neoplasm of connective and other soft tissue, unspecified: Secondary | ICD-10-CM | POA: Insufficient documentation

## 2012-03-28 DIAGNOSIS — R102 Pelvic and perineal pain unspecified side: Secondary | ICD-10-CM | POA: Insufficient documentation

## 2012-03-28 DIAGNOSIS — Z8639 Personal history of other endocrine, nutritional and metabolic disease: Secondary | ICD-10-CM | POA: Insufficient documentation

## 2012-04-03 ENCOUNTER — Ambulatory Visit (INDEPENDENT_AMBULATORY_CARE_PROVIDER_SITE_OTHER): Payer: BC Managed Care – PPO | Admitting: Obstetrics and Gynecology

## 2012-04-03 ENCOUNTER — Encounter: Payer: Self-pay | Admitting: Obstetrics and Gynecology

## 2012-04-03 VITALS — BP 146/70 | Wt 247.5 lb

## 2012-04-03 DIAGNOSIS — R102 Pelvic and perineal pain: Secondary | ICD-10-CM

## 2012-04-03 DIAGNOSIS — D259 Leiomyoma of uterus, unspecified: Secondary | ICD-10-CM

## 2012-04-03 DIAGNOSIS — N949 Unspecified condition associated with female genital organs and menstrual cycle: Secondary | ICD-10-CM

## 2012-04-03 DIAGNOSIS — D219 Benign neoplasm of connective and other soft tissue, unspecified: Secondary | ICD-10-CM

## 2012-04-03 NOTE — Progress Notes (Signed)
GYN PROBLEM VISIT  Subjective: [name] is a 56 y.o. year old female,G2P2002, who presents for a problem visit. She is here to follow up abdominal pain which has been a problem off and on for years.  She was seen here most recently in 08/2011 for the pain, and the recommendation made for ultrasound which was not definitive in its ability to determine endometrial thickness or to rule out mass.  Pelvic MRI was recommended, but the patient states she forgot about it because the pain resolved.  It started again in 02/2012, so MRI was ordered again.  The pt completed the noncontrast portion of the MRI, but had a reaction to the contrast requiring transport to the hospital for observation. She states the pain is unpredictable, intermittent, sometimes sharp enough to stop her in her tracks while she is walking.  It is not associated with any GI or GU sx.  She states it has made it hard for her to start an exercise program to try to loose weight. It is located in the epigastric region, left mid abdomen and right mid and upper abdomen.  She had a diagnostic laparoscopy in 1992 with lysis of adhesions with relief of pelvic pain, but pt states this pain is different and located in her abdomen. She has a family hx of GI cancer, either colon or stomach, and had a nl colonoscopy in 2009 with repeat due in 2014.  She had a negative abdominal CT in 2009 as well     Objective:  BP 146/70  Wt 247 lb 8 oz (112.265 kg)   MRI of pelvis Findings: Enlarged uterus measuring 11.6 cm x 7.8 cm by 9.3 cm.  Large, partially submucosal fibroid within right lateral wall of  the uterus measures 4.7 x 3.9 x 4.5 cm. This appears to have mass  effect upon the lower uterine segment. Cystic dilatation of the  uterine cavity is noted within the fundus which measures 4.4 x 3.5  x 3.5 cm. Near the left cornua there is a thin septation, image 11  of series 6. The cervix appears within normal limits. No discrete    endometrial mass or polyp identified.  The adnexal structures appear normal.  Urinary bladder appears normal. No adenopathy within the pelvis.  There is no significant free fluid.   Assessment: 1) Long standing uterine fibroids 2) No clear Gyn etiology for abdominal pain 3) Allergic reaction to contrast dye  Plan: Referral for GI workup Will confer with radiologist concerning any additional information which can be obtained from contrast portion of study vs an additional study which may supplement the current information Followup for AEX in 08/2102 or prn        Dierdre Forth, MD  04/03/2012 4:35 PM

## 2012-04-07 ENCOUNTER — Telehealth: Payer: Self-pay

## 2012-04-07 NOTE — Telephone Encounter (Signed)
Referral appt for Dr. Matthias Hughs sched 05/03/12 @ 3:45p. Referral faxed to [phone]. Unable to leave a message for pt to cb due to full vm. Will attempt to call pt at a later time.

## 2012-04-10 NOTE — Telephone Encounter (Signed)
Tc to pt per referral appt. Unable to leave message for pt due to full vm. Chart forwarded to have letter sent to call the office.

## 2012-04-27 ENCOUNTER — Telehealth: Payer: Self-pay

## 2012-04-27 NOTE — Telephone Encounter (Signed)
Lm on vm to cb per follow up rgdg referral appt with Dr.Buccini.

## 2012-04-27 NOTE — Telephone Encounter (Signed)
Pt aware of appt sched 05/03/12 @ 3:45 p with Dr. Matthias Hughs. Pt voices understanding. Address updated in system.

## 2012-06-19 ENCOUNTER — Encounter: Payer: Self-pay | Admitting: Obstetrics and Gynecology

## 2012-06-19 ENCOUNTER — Ambulatory Visit: Payer: BC Managed Care – PPO | Admitting: Obstetrics and Gynecology

## 2012-06-19 VITALS — BP 130/70 | Temp 98.5°F | Wt 250.0 lb

## 2012-06-19 DIAGNOSIS — Z124 Encounter for screening for malignant neoplasm of cervix: Secondary | ICD-10-CM

## 2012-06-19 DIAGNOSIS — Z30430 Encounter for insertion of intrauterine contraceptive device: Secondary | ICD-10-CM

## 2012-06-19 MED ORDER — LEVONORGESTREL 20 MCG/24HR IU IUD: INTRAUTERINE_SYSTEM | Freq: Once | INTRAUTERINE | Status: AC

## 2012-06-19 NOTE — Progress Notes (Signed)
GYN PROBLEM VISIT   [name] is a 57 y.o. year old female,G2P2002, who presents for a problem visit.   Subjective: Pt is here for f/u abdominal pain.She had an MRI with reaction to MRI dye but with findings that may be consistent with degenrating fibroids.  She has also had a colonoscopy which was neg.  Since that time 6 weeks ago, she has had only 2 episodes of pain, both short lived, self limited, much milder than previously,  and requiring no pain medication.  She notes improved bowel function with regular BMs after each meal.  Objective:  BP 130/70  Temp 98.5 F (36.9 C) (Oral)  Wt 250 lb (113.399 kg)   Abd:  Obese without palapable masses or organomegaly.  No tenderness or rebound   Assessment: Migratory abdominal/pelvic pain without clear etiology, but with significant improvement s/p colonoscopy.  Possibly due to bowel prep and/or improved bowel function.  Plan: Pt is to calendar pain over the next few mos till aex. D/C all milk products. Return to office for aex   Dierdre Forth, MD  06/19/2012 6:19 PM

## 2012-07-25 ENCOUNTER — Other Ambulatory Visit: Payer: Self-pay | Admitting: *Deleted

## 2012-07-25 DIAGNOSIS — C50919 Malignant neoplasm of unspecified site of unspecified female breast: Secondary | ICD-10-CM

## 2012-07-25 MED ORDER — MEGESTROL ACETATE 20 MG PO TABS: 20.0000 mg | ORAL_TABLET | Freq: Every day | ORAL | Status: DC

## 2012-09-07 ENCOUNTER — Ambulatory Visit: Payer: BC Managed Care – PPO | Admitting: Oncology

## 2012-09-07 ENCOUNTER — Telehealth: Payer: Self-pay | Admitting: *Deleted

## 2012-09-07 NOTE — Telephone Encounter (Signed)
Pt called to cancel her appt, due to finals next week. She will call back to r/s her appt at a later time...td

## 2012-09-10 ENCOUNTER — Other Ambulatory Visit: Payer: Self-pay | Admitting: Oncology

## 2012-09-10 DIAGNOSIS — C50912 Malignant neoplasm of unspecified site of left female breast: Secondary | ICD-10-CM

## 2012-09-11 ENCOUNTER — Ambulatory Visit: Payer: BC Managed Care – PPO | Admitting: Oncology

## 2012-11-24 ENCOUNTER — Other Ambulatory Visit: Payer: Self-pay | Admitting: *Deleted

## 2012-11-24 DIAGNOSIS — C50912 Malignant neoplasm of unspecified site of left female breast: Secondary | ICD-10-CM

## 2012-11-24 MED ORDER — TAMOXIFEN CITRATE 20 MG PO TABS: 20.0000 mg | ORAL_TABLET | Freq: Every day | ORAL | Status: DC

## 2012-11-27 ENCOUNTER — Telehealth: Payer: Self-pay | Admitting: *Deleted

## 2012-11-27 NOTE — Telephone Encounter (Signed)
sw pt gv appt d/t for 12/04/12 @ 2pm. Pt agreed to attend. She is aware that i will mail a letter/avs..td

## 2012-12-04 ENCOUNTER — Telehealth: Payer: Self-pay | Admitting: *Deleted

## 2012-12-04 ENCOUNTER — Ambulatory Visit (HOSPITAL_BASED_OUTPATIENT_CLINIC_OR_DEPARTMENT_OTHER): Payer: BC Managed Care – PPO | Admitting: Oncology

## 2012-12-04 ENCOUNTER — Ambulatory Visit: Payer: BC Managed Care – PPO | Admitting: Lab

## 2012-12-04 VITALS — BP 160/83 | HR 88 | Temp 99.2°F | Resp 20 | Ht 65.0 in | Wt 238.4 lb

## 2012-12-04 DIAGNOSIS — C50912 Malignant neoplasm of unspecified site of left female breast: Secondary | ICD-10-CM

## 2012-12-04 DIAGNOSIS — C50919 Malignant neoplasm of unspecified site of unspecified female breast: Secondary | ICD-10-CM

## 2012-12-04 LAB — CBC WITH DIFFERENTIAL/PLATELET
Basophils Absolute: 0 10*3/uL (ref 0.0–0.1)
EOS%: 1.4 % (ref 0.0–7.0)
HCT: 39.3 % (ref 34.8–46.6)
HGB: 12.6 g/dL (ref 11.6–15.9)
MCH: 27.1 pg (ref 25.1–34.0)
MCV: 84.5 fL (ref 79.5–101.0)
MONO%: 4.8 % (ref 0.0–14.0)
NEUT%: 43 % (ref 38.4–76.8)

## 2012-12-04 LAB — COMPREHENSIVE METABOLIC PANEL (CC13)
AST: 15 U/L (ref 5–34)
Alkaline Phosphatase: 106 U/L (ref 40–150)
BUN: 10.3 mg/dL (ref 7.0–26.0)
Creatinine: 0.8 mg/dL (ref 0.6–1.1)
Potassium: 4.1 mEq/L (ref 3.5–5.1)

## 2012-12-04 MED ORDER — TAMOXIFEN CITRATE 20 MG PO TABS: 20.0000 mg | ORAL_TABLET | Freq: Every day | ORAL | Status: DC

## 2012-12-04 NOTE — Telephone Encounter (Signed)
appts made and printed...td 

## 2012-12-04 NOTE — Telephone Encounter (Signed)
appts made and printed. Pt is arer that breast center will call me abck to schedule her, due to she needs to speak w/ radiology.... td

## 2012-12-04 NOTE — Progress Notes (Signed)
ID: [name]   DOB: [birth year]  MR#: [id number]  WUJ#:[id number]  HISTORY OF PRESENT ILLNESS: The patient had a screening mammogram May 18, 2006 at the [facility], which really showed no suspicious findings, but since she was having some pain in the left breast an ultrasound was obtained on January 17.  The area where she was having pain turned out to be unremarkable, but it uncovered a separate area measuring about 4 mm with some spiculation, as well as a second even smaller vague hypoechoic area.  Both of these were suspicious.  The axilla was negative.  With this information, the patient had an ultrasound-guided core biopsy of the larger lesion on June 08, 2006.  This showed an invasive breast cancer, which was initially felt likely to be lobular.  It was strongly ER and PR positive at 96% and 99% with a low proliferation marker at 5%, HER-2/neu equivocal at 2+, but negative by [facility] with a ration of 1.26.  Bilateral breast MRIs were obtained June 24, 2006.  This actually showed 3 different areas of abnormal in the left breast, in different quadrants.  One, of course, was the one that had been previously biopsied. One of the other 2 areas was biopsied under MRI guidance on July 08, 2006, and this showed ([id number] and [id number]) again a strongly ER and PR positive infiltrating breast cancer, which was read initially as a ductal carcinoma, the actual numbers being 97% positivity for the ER and 100% for the PR.  The proliferation marker here was higher at 18%.  The HercepTest was lower at 0.    With this information, after appropriate discussion, Dr. Lurene Shadow proceeded to left mastectomy and sentinel lymph node sampling on August 19, 2006.  The final pathology there ([id number]) showed at least 3 areas of invasive carcinoma, as well as some areas of lobular carcinoma in situ.  The largest invasive component measured 1.7 cm, it was ductal, margins were ample, the tumor grade was 2, and both  lymph nodes were negative. Subsequent treatment is as detailed below.  INTERVAL HISTORY: [name] returns today for followup of her breast cancer. Interval history is unremarkable. She thought she had had mammography this year but I do not find a record of that.  REVIEW OF SYSTEMS: She is experiencing pain in the right breast and is worrisome her because that was the symptom that brought her to seek mammographic evaluation originally. She continues to sleep poorly, describes herself is severely fatigued. She has intermittent aches and pains in muscles and joints which are not very constant or more intense than prior. She is short of breath when walking up stairs. She has some urinary stress incontinence. She has rare headaches. She feels forgetful. She is having moderate hot flashes. Otherwise a detailed review of systems today was stable  PAST MEDICAL HISTORY: Significant for bladder incontinence, which has been evaluated by Dr. Logan Bores, history of migraines, which have been evaluated by Dr. Sandria Manly, history of hypercholesterolemia, history of borderline diabetes, history of obesity, history of GERD, history of what sounds like fibroids in her uterus and history of tobacco abuse with a 9-pack-year history, the patient quitting in 1980.    FAMILY HISTORY The patient's father lives in [place] and she has very little information about him.  The patient's mother, also from [place], is 59 years old.  The patient's mother is 1 of 10 children, including 9 sisters.  Among the patient's mother's 8 sisters, 2 had breast cancer, both diagnosed in  their early 82s, one has died, one survives.  The patient herself has multiple half-brothers and half-sisters, but no one with breast or ovarian cancer to the best of her knowledge.  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  Her husband Loraine Leriche  teaches math at [employer].  Their daughter Thomasenia Sales, is Tour manager in [place].  The patient's daughter Towanda Malkin is at home.  The patient is a Control and instrumentation engineer.   ADVANCED DIRECTIVES: not in place  HEALTH MAINTENANCE: History  Substance Use Topics  . Smoking status: Former Smoker    Quit date: 06/08/1981  . Smokeless tobacco: Not on file  . Alcohol Use: .5 - 1 oz/week    1-2 drink(s) per week     Colonoscopy: due  PAP: UTD  Bone density: November 2009/ normal  Lipid panel:  Allergies  Allergen Reactions  . Other Shortness Of Breath    Contrast dye- Throat swelling  . Multihance (Gadobenate Dimeglumine) Hives, Itching and Nausea Only    Pt had breast mri with multihance, was at first nauseated, clammy and dizzy. Then after about 15 minutes had rash,itching and hives. Treated with 50mg  of benadryl at office and seen by dr. Alfredo Batty  . Amoxicillin (Amoxicillin) Swelling  . Kiwi Extract Nausea And Vomiting and Rash    Current Outpatient Prescriptions  Medication Sig Dispense Refill  . diphenhydrAMINE (BENADRYL) 25 MG tablet Take 1-2 tablets (25-50 mg total) by mouth every 6 (six) hours as needed for itching.  21 tablet  0  . fish oil-omega-3 fatty acids 1000 MG capsule Take 2 g by mouth daily.      . megestrol (MEGACE) 20 MG tablet Take 1 tablet (20 mg total) by mouth at bedtime.  90 tablet  0  . MELATONIN PO Take 1 tablet by mouth at bedtime.      . Multiple Vitamin (MULTIVITAMIN WITH MINERALS) TABS Take 1 tablet by mouth daily.      Marland Kitchen RASPBERRY KETONES PO Take by mouth.      . tamoxifen (NOLVADEX) 20 MG tablet Take 1 tablet (20 mg total) by mouth daily.  90 tablet  0  . VITAMIN D, CHOLECALCIFEROL, PO Take 2,000 Units by mouth.      . vitamin E (VITAMIN E) 400 UNIT capsule Take 400 Units by mouth daily.       No current facility-administered medications for this visit.   Facility-Administered Medications Ordered in Other Visits  Medication Dose Route Frequency Provider Last Rate Last  Dose  . levonorgestrel (MIRENA) 20 MCG/24HR IUD   Intrauterine Once Hal Morales, MD        OBJECTIVE: middle-aged African American woman who appears well Filed Vitals:   12/04/12 1428  BP: 160/83  Pulse: 88  Temp: 99.2 F (37.3 C)  Resp: 20     Body mass index is 39.67 kg/(m^2).    ECOG FS: 0  Sclerae unicteric Oropharynx clear No peripheral adenopathy Lungs no rales or rhonchi Heart regular rate and rhythm Abd obese, benign MSK no focal spinal tenderness, no peripheral edema Neuro: nonfocal, well oriented, pleasant affect Breasts: Careful palpation of the right breast shows no skin or nipple change, and the right axilla is benign; the left breast is status post mastectomy; there is no evidence of local recurrence  LAB RESULTS: Lab Results  Component Value Date   WBC 6.7 08/31/2011   NEUTROABS 2.6 08/31/2011  HGB 13.3 03/20/2012   HCT 39.0 03/20/2012   MCV 85.0 08/31/2011   PLT 158 08/31/2011      Chemistry      Component Value Date/Time   NA 142 03/20/2012 1844   K 4.0 03/20/2012 1844   CL 110 03/20/2012 1844   CO2 27 08/31/2011 1629   BUN 16 03/20/2012 1844   CREATININE 0.90 03/20/2012 1844      Component Value Date/Time   CALCIUM 9.4 08/31/2011 1629   ALKPHOS 67 08/31/2011 1629   AST 19 08/31/2011 1629   ALT 11 08/31/2011 1629   BILITOT 0.2* 08/31/2011 1629       Lab Results  Component Value Date   LABCA2 16 08/31/2011    No components found with this basename: [id number]    No results found for this basename: INR,  in the last 168 hours  Urinalysis No results found for this basename: colorurine,  appearanceur,  labspec,  phurine,  glucoseu,  hgbur,  bilirubinur,  ketonesur,  proteinur,  urobilinogen,  nitrite,  leukocytesur    STUDIES: Most recent mammography and breast MRI that I can find is from 2013.   ASSESSMENT: 57 y.o. [name], [place] woman status post left mastectomy and sentinel lymph node dissection April 2008 for a multicentric T1cN0, stage IA,  invasive ductal carcinoma, grade 2, strongly estrogen and progesterone receptor positive, HER-2 negative, with a low proliferation fraction and a low Oncotype DX score.  She has been on tamoxifen since April 2008 with good tolerance.  PLAN: We are continuing the tamoxifen to complete 10 years, as discussed at the last visit here. She is behind in her mammography, and I have set her up for her right diagnostic mammogram at the breast Center within the next week or 2. I suggested she go ahead and get tomography with it. However my exam today is unremarkable.  She continues to have problems sleeping. I suggested she try Benadryl 25 mg at bedtime for that. Otherwise I think she would benefit from a dietary consult and she will discuss that with Dr.Polite at their next visit.  She will see [place] again in one year. She knows to call for any problems that may develop before then.  Dyna Figuereo C    12/04/2012

## 2012-12-04 NOTE — Addendum Note (Signed)
Addended by: Billey Co on: 12/04/2012 04:57 PM   Modules accepted: Orders

## 2012-12-05 ENCOUNTER — Telehealth: Payer: Self-pay | Admitting: *Deleted

## 2012-12-05 NOTE — Telephone Encounter (Signed)
The pt mailbox is full and can not received messages at this time. i will mail her a letter/cal informing her about her mammo appt...td

## 2012-12-21 ENCOUNTER — Ambulatory Visit: Payer: BC Managed Care – PPO

## 2013-11-22 ENCOUNTER — Telehealth: Payer: Self-pay | Admitting: *Deleted

## 2013-11-22 NOTE — Telephone Encounter (Signed)
Notified pt of appointment and provider change

## 2013-11-26 ENCOUNTER — Other Ambulatory Visit: Payer: Self-pay | Admitting: *Deleted

## 2013-11-26 DIAGNOSIS — C50912 Malignant neoplasm of unspecified site of left female breast: Secondary | ICD-10-CM

## 2013-11-27 ENCOUNTER — Other Ambulatory Visit: Payer: BC Managed Care – PPO

## 2013-11-29 ENCOUNTER — Other Ambulatory Visit: Payer: Self-pay | Admitting: Obstetrics and Gynecology

## 2013-11-29 DIAGNOSIS — N644 Mastodynia: Secondary | ICD-10-CM

## 2013-11-29 DIAGNOSIS — Z9012 Acquired absence of left breast and nipple: Secondary | ICD-10-CM

## 2013-12-03 ENCOUNTER — Telehealth: Payer: Self-pay | Admitting: Hematology

## 2013-12-03 ENCOUNTER — Telehealth: Payer: Self-pay | Admitting: *Deleted

## 2013-12-03 ENCOUNTER — Other Ambulatory Visit: Payer: Self-pay | Admitting: *Deleted

## 2013-12-03 NOTE — Telephone Encounter (Signed)
per pof to sch pt lab-Staci states will call pt to give time & date

## 2013-12-03 NOTE — Telephone Encounter (Signed)
This RN tried calling patient's home number to make her aware of her add-on lab appointment for 12/04/13 at 3:00 pm before MD visit at 3:30pm. Was unable to leave a message.

## 2013-12-04 ENCOUNTER — Encounter: Payer: Self-pay | Admitting: Hematology

## 2013-12-04 ENCOUNTER — Ambulatory Visit (HOSPITAL_BASED_OUTPATIENT_CLINIC_OR_DEPARTMENT_OTHER): Payer: BC Managed Care – PPO | Admitting: Hematology

## 2013-12-04 ENCOUNTER — Other Ambulatory Visit (HOSPITAL_BASED_OUTPATIENT_CLINIC_OR_DEPARTMENT_OTHER): Payer: BC Managed Care – PPO

## 2013-12-04 VITALS — BP 137/61 | HR 78 | Temp 98.7°F | Resp 18 | Ht 65.0 in | Wt 241.8 lb

## 2013-12-04 DIAGNOSIS — C50919 Malignant neoplasm of unspecified site of unspecified female breast: Secondary | ICD-10-CM

## 2013-12-04 DIAGNOSIS — N644 Mastodynia: Secondary | ICD-10-CM

## 2013-12-04 DIAGNOSIS — Z803 Family history of malignant neoplasm of breast: Secondary | ICD-10-CM

## 2013-12-04 DIAGNOSIS — R5381 Other malaise: Secondary | ICD-10-CM

## 2013-12-04 DIAGNOSIS — M7989 Other specified soft tissue disorders: Secondary | ICD-10-CM

## 2013-12-04 DIAGNOSIS — E119 Type 2 diabetes mellitus without complications: Secondary | ICD-10-CM

## 2013-12-04 DIAGNOSIS — Z170 Estrogen receptor positive status [ER+]: Secondary | ICD-10-CM

## 2013-12-04 DIAGNOSIS — C50912 Malignant neoplasm of unspecified site of left female breast: Secondary | ICD-10-CM

## 2013-12-04 DIAGNOSIS — G4700 Insomnia, unspecified: Secondary | ICD-10-CM

## 2013-12-04 DIAGNOSIS — R5383 Other fatigue: Secondary | ICD-10-CM

## 2013-12-04 LAB — COMPREHENSIVE METABOLIC PANEL (CC13)
ALT: 13 U/L (ref 0–55)
ANION GAP: 8 meq/L (ref 3–11)
AST: 14 U/L (ref 5–34)
Albumin: 3.7 g/dL (ref 3.5–5.0)
Alkaline Phosphatase: 82 U/L (ref 40–150)
BUN: 13.4 mg/dL (ref 7.0–26.0)
CALCIUM: 9.7 mg/dL (ref 8.4–10.4)
CHLORIDE: 104 meq/L (ref 98–109)
CO2: 27 meq/L (ref 22–29)
CREATININE: 0.8 mg/dL (ref 0.6–1.1)
GLUCOSE: 146 mg/dL — AB (ref 70–140)
Potassium: 4.4 mEq/L (ref 3.5–5.1)
SODIUM: 139 meq/L (ref 136–145)
TOTAL PROTEIN: 8 g/dL (ref 6.4–8.3)
Total Bilirubin: 0.36 mg/dL (ref 0.20–1.20)

## 2013-12-04 LAB — CBC WITH DIFFERENTIAL/PLATELET
BASO%: 0.8 % (ref 0.0–2.0)
Basophils Absolute: 0.1 10*3/uL (ref 0.0–0.1)
EOS ABS: 0 10*3/uL (ref 0.0–0.5)
EOS%: 0.6 % (ref 0.0–7.0)
HEMATOCRIT: 39.9 % (ref 34.8–46.6)
HGB: 12.7 g/dL (ref 11.6–15.9)
LYMPH#: 3.2 10*3/uL (ref 0.9–3.3)
LYMPH%: 39.1 % (ref 14.0–49.7)
MCH: 27.1 pg (ref 25.1–34.0)
MCHC: 31.7 g/dL (ref 31.5–36.0)
MCV: 85.5 fL (ref 79.5–101.0)
MONO#: 0.5 10*3/uL (ref 0.1–0.9)
MONO%: 5.8 % (ref 0.0–14.0)
NEUT%: 53.7 % (ref 38.4–76.8)
NEUTROS ABS: 4.4 10*3/uL (ref 1.5–6.5)
PLATELETS: 198 10*3/uL (ref 145–400)
RBC: 4.66 10*6/uL (ref 3.70–5.45)
RDW: 14.1 % (ref 11.2–14.5)
WBC: 8.1 10*3/uL (ref 3.9–10.3)

## 2013-12-04 NOTE — Progress Notes (Signed)
ID: [name]   DOB: [birth year]  MR#: [id number]  CSN#:[id number]   REASON FOR OFFICE VISIT: Breast cancer follow up.  PATIENT IDENTIFICATION:  58 y.o. [name], [place] woman had a screening mammogram May 18, 2006 at the [facility], which really showed no suspicious findings, but since she was having some pain in the left breast an ultrasound was obtained on January 17.  The area where she was having pain turned out to be unremarkable, but it uncovered a separate area measuring about 4 mm with some spiculation, as well as a second even smaller vague hypoechoic area.  Both of these were suspicious.  The axilla was negative.  With this information, the patient had an ultrasound-guided core biopsy of the larger lesion on June 08, 2006.  This showed an invasive breast cancer, which was initially felt likely to be lobular.  It was strongly ER and PR positive at 96% and 99% with a low proliferation marker at 5%, HER-2/neu equivocal at 2+, but negative by [facility] with a ration of 1.26.  Bilateral breast MRIs were obtained June 24, 2006.  This actually showed 3 different areas of abnormal in the left breast, in different quadrants.  One, of course, was the one that had been previously biopsied. One of the other 2 areas was biopsied under MRI guidance on July 08, 2006, and this showed ([id number] and [id number]) again a strongly ER and PR positive infiltrating breast cancer, which was read initially as a ductal carcinoma, the actual numbers being 97% positivity for the ER and 100% for the PR.  The proliferation marker here was higher at 18%.  The HercepTest was lower at 0.    With this information, after appropriate discussion, Dr. Bubba Camp proceeded to left mastectomy and sentinel lymph node sampling on August 19, 2006.  The final pathology there ([id number]) showed at least 3 areas of invasive carcinoma, as well as some areas of lobular carcinoma in situ.  The largest invasive component measured 1.7 cm, it  was ductal, margins were ample, the tumor grade was 2, and both lymph nodes were negative. She has been on tamoxifen since April 2008 with good tolerance   INTERVAL HISTORY: [name] returns today for followup of her breast cancer. Interval history is unremarkable. Patient was recently diagnosed with type 2 diabetes and started taking metformin on 11/27/2013 by her family Dr. [name]. She also has complaints of left knee swelling and has received a steroid injection in her left knee. She was seen by Dr. [name] and also started on meloxicam 15 mg daily. She is concerned about the side effects of meloxicam and ordered some kind of natural remedy after watching a TV show. Patient is also complained of some sharp pain on the right breast scheduled to undergo a 3-D mammography on 12/06/2013 at the breast Center. Patient has stopped using oxybutynin which was prescribed for bladder control and as a result she is wearing diapers. She has had bladder problems in the past. She continues to tolerate tamoxifen very well and is in her seventh year. She'll complete the full 10 year duration of tamoxifen and I did discuss with her the results of ATLAS and aTTom studies showing a benefit of 10 years of Tamoxifen vs 5 years. She has used some Benadryl on prn basis for insomnia.   REVIEW OF SYSTEMS: She is experiencing pain in the right breast and is worrisome her because that was the symptom that brought her to seek mammographic evaluation originally. She continues to  sleep poorly, describes herself is severely fatigued. She has intermittent aches and pains in muscles and joints which are not very constant or more intense than prior. She is short of breath when walking up stairs. She has some urinary stress incontinence. She has rare headaches. She feels forgetful. She is having moderate hot flashes. Otherwise a detailed review of systems today was stable and as listed above in interval history.  PAST MEDICAL  HISTORY: Significant for bladder incontinence, which has been evaluated by Dr. [name], history of migraines, which have been evaluated by Dr. [name], history of hypercholesterolemia, history of borderline diabetes, history of obesity, history of GERD, history of what sounds like fibroids in her uterus and history of tobacco abuse with a 9-pack-year history, the patient quitting in 1980.    FAMILY HISTORY The patient's father lives in [place] and she has very little information about him.  The patient's mother, also from [place], is 60 years old.  The patient's mother is 1 of 10 children, including 9 sisters.  Among the patient's mother's 8 sisters, 2 had breast cancer, both diagnosed in their early 58s, one has died, one survives.  The patient herself has multiple half-brothers and half-sisters, but no one with breast or ovarian cancer to the best of her knowledge.  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  Her husband [name] teaches math at [employer].  Their daughter [name], is Facilities manager in [place].  The patient's daughter [name] is at home.  The patient is a Psychologist, forensic. She is thinking about enrolling in a PhD 3 year program.  ADVANCED DIRECTIVES: not in place  HEALTH MAINTENANCE: History  Substance Use Topics  . Smoking status: Former Smoker    Quit date: 06/08/1981  . Smokeless tobacco: Not on file  . Alcohol Use: 0.5 - 1.0 oz/week    1-2 drink(s) per week     Colonoscopy: due  PAP: UTD  Bone density: November 2009/ normal  Lipid panel: not seen in EPIC labs  Allergies  Allergen Reactions  . Other Shortness Of Breath    Contrast dye- Throat swelling  . Multihance [Gadobenate Dimeglumine] Hives, Itching and Nausea Only    Pt had breast mri with multihance, was at first nauseated, clammy and dizzy. Then after about 15 minutes had rash,itching and  hives. Treated with 110m of benadryl at office and seen by dr. mJobe Igo . Amoxicillin [Amoxicillin] Swelling  . Kiwi Extract Nausea And Vomiting and Rash    Current Outpatient Prescriptions  Medication Sig Dispense Refill  . fish oil-omega-3 fatty acids 1000 MG capsule Take 2 g by mouth daily.      . magnesium 30 MG tablet Take 30 mg by mouth 2 (two) times daily.      . megestrol (MEGACE) 20 MG tablet Take 1 tablet (20 mg total) by mouth at bedtime.  90 tablet  0  . meloxicam (MOBIC) 15 MG tablet Take 15 mg by mouth daily as needed.      . metFORMIN (GLUCOPHAGE) 500 MG tablet Take 500 mg by mouth 2 (two) times daily.      . Multiple Vitamin (MULTIVITAMIN WITH MINERALS) TABS Take 1 tablet by mouth daily.      .Marland Kitchenoxybutynin (DITROPAN-XL) 5 MG 24 hr tablet Take 5 mg by mouth daily.      .Marland KitchenRASPBERRY KETONES PO Take by mouth.      .Marland Kitchen  tamoxifen (NOLVADEX) 20 MG tablet Take 1 tablet (20 mg total) by mouth daily.  90 tablet  0  . VITAMIN D, CHOLECALCIFEROL, PO Take 2,000 Units by mouth.      . vitamin E (VITAMIN E) 400 UNIT capsule Take 400 Units by mouth daily.      Marland Kitchen zinc sulfate 220 MG capsule Take 220 mg by mouth daily.       No current facility-administered medications for this visit.   Facility-Administered Medications Ordered in Other Visits  Medication Dose Route Frequency Provider Last Rate Last Dose  . levonorgestrel (MIRENA) 20 MCG/24HR IUD   Intrauterine Once Eldred Manges, MD        OBJECTIVE: middle-aged African American woman who appears well Filed Vitals:   12/04/13 1531  BP: 137/61  Pulse: 78  Temp: 98.7 F (37.1 C)  Resp: 18     Body mass index is 40.24 kg/(m^2).    ECOG FS: 0  Sclerae unicteric Oropharynx clear No peripheral adenopathy Lungs no rales or rhonchi Heart regular rate and rhythm Abd obese, benign MSK no focal spinal tenderness, no peripheral edema Neuro: nonfocal, well oriented, pleasant affect Breasts: Palpation of the right breast shows no skin  or nipple change, and the right axilla is benign; the left breast is status post mastectomy; there is no evidence of local recurrence  LAB RESULTS: Lab Results  Component Value Date   WBC 8.1 12/04/2013   NEUTROABS 4.4 12/04/2013   HGB 12.7 12/04/2013   HCT 39.9 12/04/2013   MCV 85.5 12/04/2013   PLT 198 12/04/2013      Chemistry      Component Value Date/Time   NA 139 12/04/2013 1441   NA 142 03/20/2012 1844   K 4.4 12/04/2013 1441   K 4.0 03/20/2012 1844   CL 110 03/20/2012 1844   CO2 27 12/04/2013 1441   CO2 27 08/31/2011 1629   BUN 13.4 12/04/2013 1441   BUN 16 03/20/2012 1844   CREATININE 0.8 12/04/2013 1441   CREATININE 0.90 03/20/2012 1844      Component Value Date/Time   CALCIUM 9.7 12/04/2013 1441   CALCIUM 9.4 08/31/2011 1629   ALKPHOS 82 12/04/2013 1441   ALKPHOS 67 08/31/2011 1629   AST 14 12/04/2013 1441   AST 19 08/31/2011 1629   ALT 13 12/04/2013 1441   ALT 11 08/31/2011 1629   BILITOT 0.36 12/04/2013 1441   BILITOT 0.2* 08/31/2011 1629      STUDIES: Most recent mammography and breast MRI is from 2013.   She is getting a mammogram on 12/06/2013  ASSESSMENT/PLAN:  1.  58 y.o. [name], [place] woman status post left mastectomy and sentinel lymph node dissection April 2008 for a multicentric T1cN0, stage IA, invasive ductal carcinoma, grade 2, strongly estrogen and progesterone receptor positive, HER-2 negative, with a low proliferation fraction and a low Oncotype DX score.  She has been on tamoxifen since April 2008 with good tolerance. Today her labs and physical exam + breast exam was normal.  2. We are continuing the tamoxifen to complete 10 years. I discussed ATLAS and aTTom studies with patient.  3. Diabetes type 2 started on metformin and dietary modification. Encouraged her to lose weight.Better glycemic control also lead to better outcomes in breast cancer survivors.  4. I also asked patient to start taking a baby aspirin for thromboprophylaxis (with concomitant use of  Tamoxifen) and also because of the data which showed that it's use is associated with a decreased risk  of distant recurrence and breast cancer death. Aspirin has relatively benign adverse effects compared with cancer chemotherapeutic drugs and may also prevent colon cancer, cardiovascular disease, and stroke.Aspirin seems to affect both ER-positive and -negative tumors. (10.1200/JCO.2009.22.7918 JCO August 03, 2008 vol. 28 no. 9 7948-0165 Tori Milks. Holmes et al).  5. RTC in 6 months with CBC,CMP AND CA27-29 prior to visit.  She knows to call for any problems that may develop before then.   Bernadene Bell, MD Medical Hematologist/Oncologist [place] Pager: [phone] Office No: [phone]   12/04/2013

## 2013-12-05 ENCOUNTER — Telehealth: Payer: Self-pay | Admitting: Hematology

## 2013-12-05 NOTE — Telephone Encounter (Signed)
S/w pt gave appts for Jan 2016. also mailed appt calendar.

## 2013-12-06 ENCOUNTER — Ambulatory Visit
Admission: RE | Admit: 2013-12-06 | Discharge: 2013-12-06 | Disposition: A | Discharge status: Home / Self Care | Payer: BC Managed Care – PPO | Source: Ambulatory Visit | Attending: Obstetrics and Gynecology | Admitting: Obstetrics and Gynecology

## 2013-12-06 ENCOUNTER — Encounter: Payer: Self-pay | Admitting: *Deleted

## 2013-12-06 ENCOUNTER — Encounter: Payer: BC Managed Care – PPO | Attending: Internal Medicine | Admitting: *Deleted

## 2013-12-06 DIAGNOSIS — Z9012 Acquired absence of left breast and nipple: Secondary | ICD-10-CM

## 2013-12-06 DIAGNOSIS — Z713 Dietary counseling and surveillance: Secondary | ICD-10-CM | POA: Insufficient documentation

## 2013-12-06 DIAGNOSIS — N644 Mastodynia: Secondary | ICD-10-CM

## 2013-12-06 DIAGNOSIS — E119 Type 2 diabetes mellitus without complications: Secondary | ICD-10-CM | POA: Diagnosis not present

## 2013-12-06 NOTE — Progress Notes (Signed)
Appt start time: 1100 end time:  1230.  Assessment:  Patient was seen on  12/06/13 for individual diabetes education. [name] is here with her husband.  She was recently diagnosed with type 2 diabetes.  She states that she has already made some changes: she eats spinach salad most days.  She has also increased other non-starchy vegetables.  She has changed her salad dressing from ranch to ginger.  She watches Dr. Irena Cords daily and tries to follow all of his recommendations.  She tries to use spices and herbs for seasoning instead of salt. She realized she ate too much salt and her ankle were swollen.   She used to skip breakfast, but now she eats yogurt most days.  [name] reports that per her doctor, she doesn't need medication or to monitor her blood sugars.  However, she has been prescribed metformin.  She used to drink soda and sweet tea, but she has cut back since her diagnosis and has increased her water intake.  She tries to exercise more, but her knee was bothering her for awhile so she hasn't been back to the gym.    Patient Education Plan per assessed needs and concerns is to attend individual session for Diabetes Self Management Education.  Current HbA1c: 7.2% per 11/27/13  Preferred Learning Style:  Auditory  Visual  Hands on    Learning Readiness:   Change in progress  MEDICATIONS: see list.  Metformin for DM  DIETARY INTAKE:  Usual eating pattern includes 3 meals and 1-2 snacks per day.  Everyday foods include proteins, starches, fruits, vegetables, sugars.  Avoided foods include none.    24-hr recall:  B ( AM): yogurt sometimes with blueberries Snk ( AM): used to eat chocolate chips cookies.  Now eats pistachios or almonds.  Some kind of shake, but she doesn't know the name  L ( PM): pizza or soup or eat out with coworkers ([employer] or [place]) Snk ( PM): chocolate chip cookies or chips.  Now tries to eat nuts instead, but doesn't portion it out and just eats out of the bag D  ( PM): mashed potatoes, baked potatoes with sour cream.  Fish or ribs.   Snk ( PM): used to eat ice cream, but she hasn't bought any since diagnosis.  She buys fruit popsicles instead Beverages: 3-5 cups of fat free milk, water  Usual physical activity: none currently   Estimated energy needs: 1800 calories 200 g carbohydrates 135 g protein 50 g fat  Progress Towards Goal(s):  In progress.   Nutritional Diagnosis:  NB-1.1 Food and nutrition-related knowledge deficit As related to proper balance of fats, carbohydrates, and proteins.  As evidenced by new diagnosis of type 2 diabetes.    Intervention:  Nutrition counseling provided.  Discussed diabetes disease process and treatment options.  Discussed physiology of diabetes and role of obesity on insulin resistance.  Encouraged moderate weight reduction to improve glucose levels.  Discussed role of medications and diet in glucose control  Provided education on macronutrients on glucose levels.  Provided education on carb counting, importance of regularly scheduled meals/snacks, and meal planning  Discussed effects of physical activity on glucose levels and long-term glucose control.    Discussed blood glucose monitoring and interpretation.  Discussed recommended target ranges and individual ranges.    Described short-term complications: hyper- and hypo-glycemia.  Discussed causes,symptoms, and treatment options.  Discussed prevention, detection, and treatment of long-term complications.  Discussed the role of prolonged elevated glucose levels on body systems.  Teaching Method Utilized:  Visual Auditory Hands on  Handouts given during visit include: Living Well with Diabetes Carb Counting and Food Label handouts Meal Plan Card  Barriers to learning/adherence to lifestyle change: none  Diabetes self-care support plan:   husband  Demonstrated degree of understanding via:  Teach Back   Monitoring/Evaluation:  Dietary  intake, exercise, and body weight in 6 week(s).

## 2014-01-03 ENCOUNTER — Other Ambulatory Visit: Payer: Self-pay | Admitting: Oncology

## 2014-01-03 DIAGNOSIS — C50919 Malignant neoplasm of unspecified site of unspecified female breast: Secondary | ICD-10-CM

## 2014-01-22 ENCOUNTER — Ambulatory Visit: Payer: BC Managed Care – PPO | Admitting: *Deleted

## 2014-03-18 ENCOUNTER — Encounter: Payer: Self-pay | Admitting: *Deleted

## 2014-04-05 ENCOUNTER — Encounter: Payer: BC Managed Care – PPO | Attending: Internal Medicine | Admitting: *Deleted

## 2014-04-05 DIAGNOSIS — E119 Type 2 diabetes mellitus without complications: Secondary | ICD-10-CM

## 2014-04-05 DIAGNOSIS — Z713 Dietary counseling and surveillance: Secondary | ICD-10-CM | POA: Insufficient documentation

## 2014-04-05 NOTE — Progress Notes (Signed)
Appt start time: 1030 end time:  1100.  Assessment:  Patient was seen on  04/05/14 for individual diabetes education. She missed her previous appointments and it's been 4 months since her last diabetes education session.  She has not made any changes.  She does not remember what we discussed and has lost her written materials I gave her previously.  She eat out a lot and chooses high fat foods.  She is inactive   Patient Education Plan per assessed needs and concerns is to attend individual session for Diabetes Self Management Education.  Current HbA1c: 7.2% per 11/27/13  Preferred Learning Style:  Auditory  Visual  Hands on    Learning Readiness:   Ready  MEDICATIONS: see list.  Metformin for DM  DIETARY INTAKE:  Usual eating pattern includes 3 meals and 1-2 snacks per day.  Everyday foods include proteins, starches, fruits, vegetables, sugars.  Avoided foods include none.    24-hr recall:  No changes  Usual physical activity: none currently   Estimated energy needs: 1800 calories 200 g carbohydrates 135 g protein 50 g fat  Progress Towards Goal(s):  No progress.   Nutritional Diagnosis:  NB-1.1 Food and nutrition-related knowledge deficit As related to proper balance of fats, carbohydrates, and proteins.  As evidenced by new diagnosis of type 2 diabetes.    Intervention:  Nutrition counseling provided.  Answered questions about carbohydrate content of specific foods.   Provided education on macronutrients on glucose levels.  Provided education on carb counting, importance of regularly scheduled meals/snacks, and meal planning.  Recommended decreasing fat content from fried foods, increasing vegetables, and avoiding buffet restaurants.     Teaching Method Utilized:  Visual Auditory Hands on  Handouts given during visit include: Meal Plan Card  Sample given: Boost Glucose Control- Chocolate Lot#  [id number] Exp: 03/12/15  Barriers to learning/adherence  to lifestyle change: willingness to change  Diabetes self-care support plan:   husband  Demonstrated degree of understanding via:  Teach Back   Monitoring/Evaluation:  Dietary intake, exercise, and body weight in 6 week(s).

## 2014-05-27 ENCOUNTER — Ambulatory Visit: Payer: BC Managed Care – PPO | Admitting: *Deleted

## 2014-05-30 ENCOUNTER — Encounter: Payer: BC Managed Care – PPO | Attending: Internal Medicine | Admitting: *Deleted

## 2014-05-30 VITALS — Wt 243.8 lb

## 2014-05-30 DIAGNOSIS — E119 Type 2 diabetes mellitus without complications: Secondary | ICD-10-CM | POA: Diagnosis not present

## 2014-05-30 DIAGNOSIS — Z713 Dietary counseling and surveillance: Secondary | ICD-10-CM | POA: Diagnosis not present

## 2014-05-30 NOTE — Progress Notes (Signed)
Appt start time: 1500 end time:  1530.  Assessment:  Patient was seen on  05/30/14 for individual diabetes education.  She has made some changes to her diet, but not changes I recommended.  She has starting limiting her breads, thinking that will help her glucose management, but she drinks excessive sugary beverages, so her total carb intake is still too high.  She continues to eat out frequently, doesn't check her glucose at home, and is inactive.    Patient Education Plan per assessed needs and concerns is to attend individual session for Diabetes Self Management Education.  Current HbA1c: ~6.7% in October of 2015.  Improvement form 7.2%  Preferred Learning Style:  Auditory  Visual  Hands on    Learning Readiness:   Contemplating.  States she wants to change, but she doesn't really make progress  MEDICATIONS: see list.  Metformin for DM  DIETARY INTAKE:  Usual eating pattern includes 3 meals and 1-2 snacks per day.  Everyday foods include proteins, starches, fruits, vegetables, sugars.  Avoided foods include none.    24-hr recall:  No changes  Usual physical activity: none currently   Estimated energy needs: 1800 calories 200 g carbohydrates 135 g protein 50 g fat  Progress Towards Goal(s):  No progress.   Nutritional Diagnosis:  NB-1.1 Food and nutrition-related knowledge deficit As related to proper balance of fats, carbohydrates, and proteins.  As evidenced by new diagnosis of type 2 diabetes.    Intervention:  Nutrition counseling provided.  Answered questions about carbohydrate content of specific foods in restaurants.  Discussed reading food labels and tips for eating out. Also answered questions about alcohol   Reviewed previous education on carb counting, importance of regularly scheduled meals/snacks, and meal planning.  Recommended decreasing fat content from fried foods, increasing vegetables, and avoiding buffet restaurants.     Teaching Method  Utilized:  Visual Auditory Hands on  Handouts given during visit include: Living Well with Diabetes 15g carb snack   Barriers to learning/adherence to lifestyle change: willingness to change  Diabetes self-care support plan:   husband  Demonstrated degree of understanding via:  Teach Back   Monitoring/Evaluation:  Dietary intake, exercise, and body weight in 3 month(s).

## 2014-05-30 NOTE — Patient Instructions (Signed)
Goals:  Follow Diabetes Meal Plan as instructed  Eat 3 meals and 2 snacks, every 3-5 hrs  Limit carbohydrate intake to 30-45 grams carbohydrate/meal  Limit carbohydrate intake to 15 grams carbohydrate/snack  Add lean protein foods to meals/snacks  Monitor glucose levels as instructed by your doctor  Aim for 30 mins of physical activity daily  Bring food record and glucose log to your next nutrition visit 

## 2014-06-05 ENCOUNTER — Telehealth: Payer: Self-pay | Admitting: Nurse Practitioner

## 2014-06-05 NOTE — Telephone Encounter (Signed)
pt cld to req cell # be taken off acct-removed adv no longer showing on acct-pt wanted to know co-pay-per card on file from last year $70-pt stated to leave appt as is

## 2014-06-06 ENCOUNTER — Other Ambulatory Visit (HOSPITAL_BASED_OUTPATIENT_CLINIC_OR_DEPARTMENT_OTHER): Payer: BC Managed Care – PPO

## 2014-06-06 DIAGNOSIS — C50919 Malignant neoplasm of unspecified site of unspecified female breast: Secondary | ICD-10-CM

## 2014-06-06 LAB — COMPREHENSIVE METABOLIC PANEL (CC13)
ALBUMIN: 3.5 g/dL (ref 3.5–5.0)
ALK PHOS: 75 U/L (ref 40–150)
ALT: 9 U/L (ref 0–55)
ANION GAP: 4 meq/L (ref 3–11)
AST: 15 U/L (ref 5–34)
BUN: 9.3 mg/dL (ref 7.0–26.0)
CALCIUM: 8.6 mg/dL (ref 8.4–10.4)
CO2: 27 meq/L (ref 22–29)
CREATININE: 0.7 mg/dL (ref 0.6–1.1)
Chloride: 108 mEq/L (ref 98–109)
EGFR: >90 mL/min/{1.73_m2} (ref >90)
Glucose: 129 mg/dl (ref 70–140)
POTASSIUM: 4.5 meq/L (ref 3.5–5.1)
Sodium: 140 mEq/L (ref 136–145)
TOTAL PROTEIN: 7.3 g/dL (ref 6.4–8.3)
Total Bilirubin: 0.2 mg/dL (ref 0.20–1.20)

## 2014-06-06 LAB — CBC WITH DIFFERENTIAL/PLATELET
BASO%: 0.4 % (ref 0.0–2.0)
Basophils Absolute: 0 10*3/uL (ref 0.0–0.1)
EOS ABS: 0.1 10*3/uL (ref 0.0–0.5)
EOS%: 1.6 % (ref 0.0–7.0)
HEMATOCRIT: 38 % (ref 34.8–46.6)
HEMOGLOBIN: 11.9 g/dL (ref 11.6–15.9)
LYMPH%: 54.1 % — ABNORMAL HIGH (ref 14.0–49.7)
MCH: 27.5 pg (ref 25.1–34.0)
MCHC: 31.3 g/dL — AB (ref 31.5–36.0)
MCV: 88 fL (ref 79.5–101.0)
MONO#: 0.3 10*3/uL (ref 0.1–0.9)
MONO%: 6.1 % (ref 0.0–14.0)
NEUT%: 37.8 % — ABNORMAL LOW (ref 38.4–76.8)
NEUTROS ABS: 2.1 10*3/uL (ref 1.5–6.5)
PLATELETS: 165 10*3/uL (ref 145–400)
RBC: 4.32 10*6/uL (ref 3.70–5.45)
RDW: 14.1 % (ref 11.2–14.5)
WBC: 5.6 10*3/uL (ref 3.9–10.3)
lymph#: 3 10*3/uL (ref 0.9–3.3)

## 2014-06-07 LAB — CANCER ANTIGEN 27.29: CA 27.29: 17 U/mL (ref 0–39)

## 2014-06-13 ENCOUNTER — Ambulatory Visit: Payer: BC Managed Care – PPO | Admitting: Nurse Practitioner

## 2014-06-13 ENCOUNTER — Telehealth: Payer: Self-pay | Admitting: *Deleted

## 2014-06-13 NOTE — Telephone Encounter (Signed)
Returned pt's phone call concerning appt for today. Pt had an emergency and couldn't get here in a timely manner . I talked to pt and told her we will get her r/s for 6 mo f/u with Susanne Borders, NP. Pt verbalized understanding. Message to be forwarded to [facility].

## 2014-06-14 ENCOUNTER — Telehealth: Payer: Self-pay | Admitting: Nurse Practitioner

## 2014-06-14 NOTE — Telephone Encounter (Signed)
per pof to sch pt appt-cld pt & left message of appt time & date

## 2014-06-27 ENCOUNTER — Telehealth: Payer: Self-pay | Admitting: Oncology

## 2014-06-27 ENCOUNTER — Ambulatory Visit: Payer: BC Managed Care – PPO | Admitting: Nurse Practitioner

## 2014-06-27 NOTE — Telephone Encounter (Signed)
pt cld to CX appt-stated she could not arrive 30 min prior to appt as that was left on her machine-Pt did not wish to r/s

## 2014-07-18 ENCOUNTER — Other Ambulatory Visit: Payer: Self-pay | Admitting: Oncology

## 2014-08-25 ENCOUNTER — Other Ambulatory Visit: Payer: Self-pay | Admitting: Oncology

## 2014-08-26 ENCOUNTER — Telehealth: Payer: Self-pay | Admitting: Oncology

## 2014-08-26 ENCOUNTER — Other Ambulatory Visit: Payer: Self-pay | Admitting: *Deleted

## 2014-08-26 NOTE — Telephone Encounter (Signed)
Left message to confirm appointment for April. Mailed calendar. °

## 2014-08-30 ENCOUNTER — Ambulatory Visit: Payer: BC Managed Care – PPO | Admitting: *Deleted

## 2014-09-03 ENCOUNTER — Other Ambulatory Visit: Payer: Self-pay | Admitting: *Deleted

## 2014-09-03 DIAGNOSIS — C50919 Malignant neoplasm of unspecified site of unspecified female breast: Secondary | ICD-10-CM

## 2014-09-04 ENCOUNTER — Telehealth: Payer: Self-pay | Admitting: Oncology

## 2014-09-04 ENCOUNTER — Other Ambulatory Visit: Payer: BC Managed Care – PPO

## 2014-09-04 ENCOUNTER — Ambulatory Visit (HOSPITAL_BASED_OUTPATIENT_CLINIC_OR_DEPARTMENT_OTHER): Payer: Self-pay | Admitting: Oncology

## 2014-09-04 DIAGNOSIS — C50912 Malignant neoplasm of unspecified site of left female breast: Secondary | ICD-10-CM

## 2014-09-04 NOTE — Telephone Encounter (Signed)
per pof to sch pt appt-cld & left pt a message of time & date of appt °

## 2014-09-04 NOTE — Progress Notes (Signed)
No show

## 2014-09-05 ENCOUNTER — Other Ambulatory Visit: Payer: Self-pay | Admitting: *Deleted

## 2014-10-01 ENCOUNTER — Telehealth: Payer: Self-pay | Admitting: Nurse Practitioner

## 2014-10-01 NOTE — Telephone Encounter (Signed)
Called and left a message with a new heather appointment

## 2014-11-08 ENCOUNTER — Ambulatory Visit: Payer: BC Managed Care – PPO | Admitting: Nurse Practitioner

## 2014-11-08 ENCOUNTER — Other Ambulatory Visit: Payer: BC Managed Care – PPO

## 2014-11-08 ENCOUNTER — Telehealth: Payer: Self-pay

## 2014-11-08 NOTE — Telephone Encounter (Signed)
Call report rcvd from [place] 11/06/14 458 pm.  Sent to scan.

## 2014-11-14 ENCOUNTER — Other Ambulatory Visit: Payer: BC Managed Care – PPO

## 2014-11-14 ENCOUNTER — Ambulatory Visit: Payer: BC Managed Care – PPO | Admitting: Nurse Practitioner

## 2015-01-18 ENCOUNTER — Ambulatory Visit (INDEPENDENT_AMBULATORY_CARE_PROVIDER_SITE_OTHER): Payer: BC Managed Care – PPO | Admitting: Family Medicine

## 2015-01-18 VITALS — BP 138/68 | HR 78 | Temp 97.6°F | Resp 16 | Ht 66.0 in | Wt 234.6 lb

## 2015-01-18 DIAGNOSIS — S90822A Blister (nonthermal), left foot, initial encounter: Secondary | ICD-10-CM | POA: Diagnosis not present

## 2015-01-18 DIAGNOSIS — T148 Other injury of unspecified body region: Secondary | ICD-10-CM

## 2015-01-18 DIAGNOSIS — E118 Type 2 diabetes mellitus with unspecified complications: Secondary | ICD-10-CM

## 2015-01-18 DIAGNOSIS — W57XXXA Bitten or stung by nonvenomous insect and other nonvenomous arthropods, initial encounter: Secondary | ICD-10-CM | POA: Diagnosis not present

## 2015-01-18 DIAGNOSIS — T63421A Toxic effect of venom of ants, accidental (unintentional), initial encounter: Secondary | ICD-10-CM

## 2015-01-18 MED ORDER — TRIAMCINOLONE ACETONIDE 0.1 % EX CREA: 1.0000 "application " | TOPICAL_CREAM | Freq: Two times a day (BID) | CUTANEOUS | Status: DC

## 2015-01-18 MED ORDER — CEPHALEXIN 500 MG PO CAPS: 500.0000 mg | ORAL_CAPSULE | Freq: Four times a day (QID) | ORAL | Status: DC

## 2015-01-18 NOTE — Patient Instructions (Signed)
Take Zyrtec (cetirizine) 10 mg daily for a few days  Try to avoid scratching and popping the blister  If you believe it is getting infected please come in  Use triamcinolone cream 2 or 3 times a day on the blister  Take the antibiotics, cephalexin (Keflex) one pill 3 times daily  Return as needed
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
## 2015-01-19 ENCOUNTER — Other Ambulatory Visit: Payer: Self-pay | Admitting: Oncology

## 2015-02-23 ENCOUNTER — Other Ambulatory Visit: Payer: Self-pay | Admitting: Oncology

## 2015-03-24 ENCOUNTER — Ambulatory Visit (INDEPENDENT_AMBULATORY_CARE_PROVIDER_SITE_OTHER): Payer: BC Managed Care – PPO

## 2015-03-24 ENCOUNTER — Ambulatory Visit (INDEPENDENT_AMBULATORY_CARE_PROVIDER_SITE_OTHER): Payer: BC Managed Care – PPO | Admitting: Family Medicine

## 2015-03-24 DIAGNOSIS — M545 Low back pain, unspecified: Secondary | ICD-10-CM

## 2015-03-24 DIAGNOSIS — M542 Cervicalgia: Secondary | ICD-10-CM

## 2015-03-24 DIAGNOSIS — S134XXA Sprain of ligaments of cervical spine, initial encounter: Secondary | ICD-10-CM

## 2015-03-24 MED ORDER — CYCLOBENZAPRINE HCL 5 MG PO TABS
ORAL_TABLET | ORAL | Status: DC
Start: 2015-03-24 — End: 2015-04-19

## 2015-03-24 MED ORDER — CYCLOBENZAPRINE HCL 5 MG PO TABS: ORAL_TABLET | ORAL | Status: DC
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
## 2015-03-24 NOTE — Patient Instructions (Addendum)
Take your diclofenac one with breakfast and one with dinner. Take the flexeril at night 2 tabs and then you can take additional with breakfast and with dinner if you would like. Apply heat 20 min every 2-3 hours to your neck and low back. Apply ice to your knees twice a day.  Recheck with me on Wednesday from 8-2.  Cervical Sprain A cervical sprain is an injury in the neck in which the strong, fibrous tissues (ligaments) that connect your neck bones stretch or tear. Cervical sprains can range from mild to severe. Severe cervical sprains can cause the neck vertebrae to be unstable. This can lead to damage of the spinal cord and can result in serious nervous system problems. The amount of time it takes for a cervical sprain to get better depends on the cause and extent of the injury. Most cervical sprains heal in 1 to 3 weeks. CAUSES  Severe cervical sprains may be caused by:   Contact sport injuries (such as from football, rugby, wrestling, hockey, auto racing, gymnastics, diving, martial arts, or boxing).   Motor vehicle collisions.   Whiplash injuries. This is an injury from a sudden forward and backward whipping movement of the head and neck.  Falls.  Mild cervical sprains may be caused by:   Being in an awkward position, such as while cradling a telephone between your ear and shoulder.   Sitting in a chair that does not offer proper support.   Working at a poorly Landscape architect station.   Looking up or down for long periods of time.  SYMPTOMS   Pain, soreness, stiffness, or a burning sensation in the front, back, or sides of the neck. This discomfort may develop immediately after the injury or slowly, 24 hours or more after the injury.   Pain or tenderness directly in the middle of the back of the neck.   Shoulder or upper back pain.   Limited ability to move the neck.   Headache.   Dizziness.   Weakness, numbness, or tingling in the hands or arms.    Muscle spasms.   Difficulty swallowing or chewing.   Tenderness and swelling of the neck.  DIAGNOSIS  Most of the time your health care provider can diagnose a cervical sprain by taking your history and doing a physical exam. Your health care provider will ask about previous neck injuries and any known neck problems, such as arthritis in the neck. X-rays may be taken to find out if there are any other problems, such as with the bones of the neck. Other tests, such as a CT scan or MRI, may also be needed.  TREATMENT  Treatment depends on the severity of the cervical sprain. Mild sprains can be treated with rest, keeping the neck in place (immobilization), and pain medicines. Severe cervical sprains are immediately immobilized. Further treatment is done to help with pain, muscle spasms, and other symptoms and may include:  Medicines, such as pain relievers, numbing medicines, or muscle relaxants.   Physical therapy. This may involve stretching exercises, strengthening exercises, and posture training. Exercises and improved posture can help stabilize the neck, strengthen muscles, and help stop symptoms from returning.  HOME CARE INSTRUCTIONS   Put ice on the injured area.   Put ice in a plastic bag.   Place a towel between your skin and the bag.   Leave the ice on for 15-20 minutes, 3-4 times a day.   If your injury was severe, you may have been  given a cervical collar to wear. A cervical collar is a two-piece collar designed to keep your neck from moving while it heals.  Do not remove the collar unless instructed by your health care provider.  If you have long hair, keep it outside of the collar.  Ask your health care provider before making any adjustments to your collar. Minor adjustments may be required over time to improve comfort and reduce pressure on your chin or on the back of your head.  Ifyou are allowed to remove the collar for cleaning or bathing, follow your  health care provider's instructions on how to do so safely.  Keep your collar clean by wiping it with mild soap and water and drying it completely. If the collar you have been given includes removable pads, remove them every 1-2 days and hand wash them with soap and water. Allow them to air dry. They should be completely dry before you wear them in the collar.  If you are allowed to remove the collar for cleaning and bathing, wash and dry the skin of your neck. Check your skin for irritation or sores. If you see any, tell your health care provider.  Do not drive while wearing the collar.   Only take over-the-counter or prescription medicines for pain, discomfort, or fever as directed by your health care provider.   Keep all follow-up appointments as directed by your health care provider.   Keep all physical therapy appointments as directed by your health care provider.   Make any needed adjustments to your workstation to promote good posture.   Avoid positions and activities that make your symptoms worse.   Warm up and stretch before being active to help prevent problems.  SEEK MEDICAL CARE IF:   Your pain is not controlled with medicine.   You are unable to decrease your pain medicine over time as planned.   Your activity level is not improving as expected.  SEEK IMMEDIATE MEDICAL CARE IF:   You develop any bleeding.  You develop stomach upset.  You have signs of an allergic reaction to your medicine.   Your symptoms get worse.   You develop new, unexplained symptoms.   You have numbness, tingling, weakness, or paralysis in any part of your body.  MAKE SURE YOU:   Understand these instructions.  Will watch your condition.  Will get help right away if you are not doing well or get worse.   This information is not intended to replace advice given to you by your health care provider. Make sure you discuss any questions you have with your health care  provider.   Document Released: 02/28/2007 Document Revised: 05/08/2013 Document Reviewed: 11/08/2012 Elsevier Interactive Patient Education [employer].

## 2015-04-16 ENCOUNTER — Other Ambulatory Visit: Payer: Self-pay

## 2015-04-16 NOTE — Telephone Encounter (Signed)
Patient requesting a refill on her muscle relaxer medication to [facility] on [address] . She is completely out and requesting to be sent today if possible. Patient request a  call back  At [phone] to let her know if it was called in.

## 2015-04-17 MED ORDER — CYCLOBENZAPRINE HCL 5 MG PO TABS: ORAL_TABLET | ORAL | Status: DC
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
## 2015-04-18 NOTE — Telephone Encounter (Signed)
Left message with instructions on her voicemail.

## 2015-04-19 ENCOUNTER — Other Ambulatory Visit: Payer: Self-pay | Admitting: Family Medicine

## 2015-04-28 NOTE — Telephone Encounter (Signed)
Rx faxed for Flexeril.

## 2015-05-27 ENCOUNTER — Other Ambulatory Visit: Payer: Self-pay | Admitting: Oncology

## 2015-05-28 ENCOUNTER — Telehealth: Payer: Self-pay | Admitting: Nurse Practitioner

## 2015-05-28 ENCOUNTER — Other Ambulatory Visit: Payer: Self-pay | Admitting: *Deleted

## 2015-05-28 NOTE — Telephone Encounter (Signed)
Called and left a message per pof with appt for 06/04/15

## 2015-06-04 ENCOUNTER — Ambulatory Visit: Payer: BC Managed Care – PPO | Admitting: Nurse Practitioner

## 2015-06-20 ENCOUNTER — Ambulatory Visit (INDEPENDENT_AMBULATORY_CARE_PROVIDER_SITE_OTHER): Payer: BC Managed Care – PPO | Admitting: Family Medicine

## 2015-06-20 VITALS — BP 136/80 | HR 70 | Temp 97.9°F | Resp 18 | Ht 66.0 in | Wt 237.0 lb

## 2015-06-20 DIAGNOSIS — L259 Unspecified contact dermatitis, unspecified cause: Secondary | ICD-10-CM

## 2015-06-20 DIAGNOSIS — M6248 Contracture of muscle, other site: Secondary | ICD-10-CM

## 2015-06-20 DIAGNOSIS — J069 Acute upper respiratory infection, unspecified: Secondary | ICD-10-CM

## 2015-06-20 DIAGNOSIS — Z111 Encounter for screening for respiratory tuberculosis: Secondary | ICD-10-CM | POA: Diagnosis not present

## 2015-06-20 DIAGNOSIS — M62838 Other muscle spasm: Secondary | ICD-10-CM

## 2015-06-20 LAB — POCT CBC
GRANULOCYTE PERCENT: 39.3 % (ref 37–80)
HEMATOCRIT: 38.1 % (ref 37.7–47.9)
Hemoglobin: 12.4 g/dL (ref 12.2–16.2)
Lymph, poc: 4.1 — AB (ref 0.6–3.4)
MCH: 27.3 pg (ref 27–31.2)
MCHC: 32.6 g/dL (ref 31.8–35.4)
MCV: 83.7 fL (ref 80–97)
MID (CBC): 0.1 (ref 0–0.9)
MPV: 8.6 fL (ref 0–99.8)
PLATELET COUNT, POC: 175 10*3/uL (ref 142–424)
POC GRANULOCYTE: 2.7 (ref 2–6.9)
POC LYMPH %: 59.3 % — AB (ref 10–50)
POC MID %: 1.4 % (ref 0–12)
RBC: 4.55 M/uL (ref 4.04–5.48)
RDW, POC: 14.9 %
WBC: 6.9 10*3/uL (ref 4.6–10.2)

## 2015-06-20 LAB — POCT SEDIMENTATION RATE: POCT SED RATE: 32 mm/hr — AB (ref 0–22)

## 2015-06-20 MED ORDER — RANITIDINE HCL 150 MG PO TABS: 150.0000 mg | ORAL_TABLET | Freq: Two times a day (BID) | ORAL | Status: DC

## 2015-06-20 MED ORDER — CETIRIZINE HCL 10 MG PO TABS: 10.0000 mg | ORAL_TABLET | Freq: Every day | ORAL | Status: DC

## 2015-06-20 MED ORDER — CETIRIZINE HCL 10 MG PO TABS
10.0000 mg | ORAL_TABLET | Freq: Once | ORAL | Status: AC
  Administered 2015-06-20: 10 mg via ORAL

## 2015-06-20 MED ORDER — CYCLOBENZAPRINE HCL 5 MG PO TABS: ORAL_TABLET | ORAL | Status: DC

## 2015-06-20 MED ORDER — TRIAMCINOLONE ACETONIDE 0.1 % EX CREA: 1.0000 "application " | TOPICAL_CREAM | Freq: Two times a day (BID) | CUTANEOUS | Status: DC

## 2015-06-20 MED ORDER — RANITIDINE HCL 150 MG PO TABS
300.0000 mg | ORAL_TABLET | Freq: Once | ORAL | Status: AC
  Administered 2015-06-20: 300 mg via ORAL

## 2015-06-20 MED ORDER — GUAIFENESIN ER 1200 MG PO TB12: 1.0000 | ORAL_TABLET | Freq: Two times a day (BID) | ORAL | Status: DC | PRN

## 2015-06-20 MED ORDER — IPRATROPIUM BROMIDE 0.03 % NA SOLN: 2.0000 | Freq: Four times a day (QID) | NASAL | Status: DC

## 2015-06-20 NOTE — Patient Instructions (Signed)
I recommend frequent warm salt water gargles, hot tea with honey and lemon, rest, and handwashing.  Hot showers or breathing in steam may help loosen the congestion.  Try a netti pot or sinus rinse is also likely to help you feel better and keep this from progressing.  Contact Dermatitis Dermatitis is redness, soreness, and swelling (inflammation) of the skin. Contact dermatitis is a reaction to certain substances that touch the skin. There are two types of contact dermatitis:   Irritant contact dermatitis. This type is caused by something that irritates your skin, such as dry hands from washing them too much. This type does not require previous exposure to the substance for a reaction to occur. This type is more common.  Allergic contact dermatitis. This type is caused by a substance that you are allergic to, such as a nickel allergy or poison ivy. This type only occurs if you have been exposed to the substance (allergen) before. Upon a repeat exposure, your body reacts to the substance. This type is less common. CAUSES  Many different substances can cause contact dermatitis. Irritant contact dermatitis is most commonly caused by exposure to:   Makeup.   Soaps.   Detergents.   Bleaches.   Acids.   Metal salts, such as nickel.  Allergic contact dermatitis is most commonly caused by exposure to:   Poisonous plants.   Chemicals.   Jewelry.   Latex.   Medicines.   Preservatives in products, such as clothing.  RISK FACTORS This condition is more likely to develop in:   People who have jobs that expose them to irritants or allergens.  People who have certain medical conditions, such as asthma or eczema.  SYMPTOMS  Symptoms of this condition may occur anywhere on your body where the irritant has touched you or is touched by you. Symptoms include:  Dryness or flaking.   Redness.   Cracks.   Itching.   Pain or a burning feeling.   Blisters.  Drainage  of small amounts of blood or clear fluid from skin cracks. With allergic contact dermatitis, there may also be swelling in areas such as the eyelids, mouth, or genitals.  DIAGNOSIS  This condition is diagnosed with a medical history and physical exam. A patch skin test may be performed to help determine the cause. If the condition is related to your job, you may need to see an occupational medicine specialist. TREATMENT Treatment for this condition includes figuring out what caused the reaction and protecting your skin from further contact. Treatment may also include:   Steroid creams or ointments. Oral steroid medicines may be needed in more severe cases.  Antibiotics or antibacterial ointments, if a skin infection is present.  Antihistamine lotion or an antihistamine taken by mouth to ease itching.  A bandage (dressing). HOME CARE INSTRUCTIONS Skin Care  Moisturize your skin as needed.   Apply cool compresses to the affected areas.  Try taking a bath with:  Epsom salts. Follow the instructions on the packaging. You can get these at your local pharmacy or grocery store.  Baking soda. Pour a small amount into the bath as directed by your health care provider.  Colloidal oatmeal. Follow the instructions on the packaging. You can get this at your local pharmacy or grocery store.  Try applying baking soda paste to your skin. Stir water into baking soda until it reaches a paste-like consistency.  Do not scratch your skin.  Bathe less frequently, such as every other day.  Bathe  in lukewarm water. Avoid using hot water. Medicines  Take or apply over-the-counter and prescription medicines only as told by your health care provider.   If you were prescribed an antibiotic medicine, take or apply your antibiotic as told by your health care provider. Do not stop using the antibiotic even if your condition starts to improve. General Instructions  Keep all follow-up visits as told  by your health care provider. This is important.  Avoid the substance that caused your reaction. If you do not know what caused it, keep a journal to try to track what caused it. Write down:  What you eat.  What cosmetic products you use.  What you drink.  What you wear in the affected area. This includes jewelry.  If you were given a dressing, take care of it as told by your health care provider. This includes when to change and remove it. SEEK MEDICAL CARE IF:   Your condition does not improve with treatment.  Your condition gets worse.  You have signs of infection such as swelling, tenderness, redness, soreness, or warmth in the affected area.  You have a fever.  You have new symptoms. SEEK IMMEDIATE MEDICAL CARE IF:   You have a severe headache, neck pain, or neck stiffness.  You vomit.  You feel very sleepy.  You notice red streaks coming from the affected area.  Your bone or joint underneath the affected area becomes painful after the skin has healed.  The affected area turns darker.  You have difficulty breathing.   This information is not intended to replace advice given to you by your health care provider. Make sure you discuss any questions you have with your health care provider.   Document Released: 04/30/2000 Document Revised: 01/22/2015 Document Reviewed: 09/18/2014 Elsevier Interactive Patient Education [employer].

## 2015-06-20 NOTE — Progress Notes (Signed)
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
## 2015-06-23 ENCOUNTER — Ambulatory Visit (INDEPENDENT_AMBULATORY_CARE_PROVIDER_SITE_OTHER): Payer: BC Managed Care – PPO | Admitting: Family Medicine

## 2015-06-23 DIAGNOSIS — Z111 Encounter for screening for respiratory tuberculosis: Secondary | ICD-10-CM

## 2015-06-23 LAB — TB SKIN TEST
INDURATION: 5 mm
TB SKIN TEST: NEGATIVE

## 2015-06-23 NOTE — Progress Notes (Signed)
   Subjective:    Patient ID: [name], female    DOB: September 03, 1955, 60 y.o.   MRN: [id number]  HPIpresents today for PPD reading. PPD placed 06/20/2015 6:10 pm. She is with-in 48-72 hour window.    Review of Systems     Objective:   Physical Exam        Assessment & Plan:  PPD reading negative  77mm induration

## 2015-07-21 ENCOUNTER — Other Ambulatory Visit: Payer: Self-pay | Admitting: Oncology

## 2015-07-22 ENCOUNTER — Other Ambulatory Visit: Payer: Self-pay | Admitting: Obstetrics and Gynecology

## 2015-07-22 DIAGNOSIS — N63 Unspecified lump in unspecified breast: Secondary | ICD-10-CM

## 2015-07-22 DIAGNOSIS — Z9012 Acquired absence of left breast and nipple: Secondary | ICD-10-CM

## 2015-07-22 DIAGNOSIS — N644 Mastodynia: Secondary | ICD-10-CM

## 2015-07-23 ENCOUNTER — Ambulatory Visit: Payer: Self-pay | Admitting: Physical Therapy

## 2015-07-24 ENCOUNTER — Telehealth: Payer: Self-pay | Admitting: Oncology

## 2015-07-24 ENCOUNTER — Ambulatory Visit
Admission: RE | Admit: 2015-07-24 | Discharge: 2015-07-24 | Disposition: A | Discharge status: Home / Self Care | Payer: BC Managed Care – PPO | Source: Ambulatory Visit | Attending: Obstetrics and Gynecology | Admitting: Obstetrics and Gynecology

## 2015-07-24 DIAGNOSIS — N644 Mastodynia: Secondary | ICD-10-CM

## 2015-07-24 DIAGNOSIS — N63 Unspecified lump in unspecified breast: Secondary | ICD-10-CM

## 2015-07-24 DIAGNOSIS — Z9012 Acquired absence of left breast and nipple: Secondary | ICD-10-CM

## 2015-07-24 NOTE — Telephone Encounter (Signed)
Patient called today to reschedule missed appointment from 1/18. Gave patient new appointment for 3/14 @ 1:15 pm.

## 2015-07-24 NOTE — Telephone Encounter (Signed)
Chart reviewed.

## 2015-07-25 ENCOUNTER — Telehealth: Payer: Self-pay | Admitting: Oncology

## 2015-07-25 NOTE — Telephone Encounter (Signed)
Patient called to r/s 3/14 f/u to 3/29 @ 1:15 pm. Patient has new date/time.

## 2015-07-29 ENCOUNTER — Encounter: Payer: Self-pay | Admitting: Physical Therapy

## 2015-07-29 ENCOUNTER — Ambulatory Visit: Payer: BC Managed Care – PPO | Attending: Family Medicine | Admitting: Physical Therapy

## 2015-07-29 ENCOUNTER — Ambulatory Visit: Payer: No Typology Code available for payment source | Admitting: Nurse Practitioner

## 2015-07-29 DIAGNOSIS — M25612 Stiffness of left shoulder, not elsewhere classified: Secondary | ICD-10-CM | POA: Insufficient documentation

## 2015-07-29 DIAGNOSIS — M25611 Stiffness of right shoulder, not elsewhere classified: Secondary | ICD-10-CM | POA: Diagnosis present

## 2015-07-29 DIAGNOSIS — M545 Low back pain, unspecified: Secondary | ICD-10-CM

## 2015-07-29 DIAGNOSIS — R531 Weakness: Secondary | ICD-10-CM | POA: Insufficient documentation

## 2015-07-29 DIAGNOSIS — M542 Cervicalgia: Secondary | ICD-10-CM | POA: Insufficient documentation

## 2015-07-30 ENCOUNTER — Encounter: Payer: Self-pay | Admitting: Physical Therapy
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
## 2015-08-05 ENCOUNTER — Encounter: Payer: No Typology Code available for payment source | Admitting: Physical Therapy

## 2015-08-13 ENCOUNTER — Telehealth: Payer: Self-pay | Admitting: Nurse Practitioner

## 2015-08-13 ENCOUNTER — Ambulatory Visit (HOSPITAL_BASED_OUTPATIENT_CLINIC_OR_DEPARTMENT_OTHER): Payer: BC Managed Care – PPO

## 2015-08-13 ENCOUNTER — Ambulatory Visit (HOSPITAL_BASED_OUTPATIENT_CLINIC_OR_DEPARTMENT_OTHER): Payer: BC Managed Care – PPO | Admitting: Nurse Practitioner

## 2015-08-13 ENCOUNTER — Encounter: Payer: Self-pay | Admitting: Nurse Practitioner

## 2015-08-13 VITALS — BP 154/60 | HR 82 | Temp 97.7°F | Resp 18 | Ht 66.0 in | Wt 235.7 lb

## 2015-08-13 DIAGNOSIS — C50912 Malignant neoplasm of unspecified site of left female breast: Secondary | ICD-10-CM

## 2015-08-13 DIAGNOSIS — Z853 Personal history of malignant neoplasm of breast: Secondary | ICD-10-CM

## 2015-08-13 DIAGNOSIS — N644 Mastodynia: Secondary | ICD-10-CM

## 2015-08-13 LAB — COMPREHENSIVE METABOLIC PANEL
ALBUMIN: 3.5 g/dL (ref 3.5–5.0)
ALK PHOS: 67 U/L (ref 40–150)
ALT: 13 U/L (ref 0–55)
AST: 15 U/L (ref 5–34)
Anion Gap: 7 mEq/L (ref 3–11)
BUN: 10.7 mg/dL (ref 7.0–26.0)
CHLORIDE: 108 meq/L (ref 98–109)
CO2: 27 mEq/L (ref 22–29)
Calcium: 9.2 mg/dL (ref 8.4–10.4)
Creatinine: 0.8 mg/dL (ref 0.6–1.1)
EGFR: >90 mL/min/{1.73_m2} (ref >90)
GLUCOSE: 195 mg/dL — AB (ref 70–140)
Potassium: 4.1 mEq/L (ref 3.5–5.1)
SODIUM: 143 meq/L (ref 136–145)
Total Bilirubin: <0.3 mg/dL (ref 0.20–1.20)
Total Protein: 7.5 g/dL (ref 6.4–8.3)

## 2015-08-13 LAB — CBC WITH DIFFERENTIAL/PLATELET
BASO%: 0.9 % (ref 0.0–2.0)
Basophils Absolute: 0.1 10*3/uL (ref 0.0–0.1)
EOS ABS: 0.1 10*3/uL (ref 0.0–0.5)
EOS%: 1.6 % (ref 0.0–7.0)
HCT: 37.3 % (ref 34.8–46.6)
HGB: 11.8 g/dL (ref 11.6–15.9)
LYMPH%: 48.4 % (ref 14.0–49.7)
MCH: 27.1 pg (ref 25.1–34.0)
MCHC: 31.7 g/dL (ref 31.5–36.0)
MCV: 85.4 fL (ref 79.5–101.0)
MONO#: 0.4 10*3/uL (ref 0.1–0.9)
MONO%: 5.4 % (ref 0.0–14.0)
NEUT#: 2.8 10*3/uL (ref 1.5–6.5)
NEUT%: 43.7 % (ref 38.4–76.8)
Platelets: 174 10*3/uL (ref 145–400)
RBC: 4.36 10*6/uL (ref 3.70–5.45)
RDW: 14.1 % (ref 11.2–14.5)
WBC: 6.5 10*3/uL (ref 3.9–10.3)
lymph#: 3.1 10*3/uL (ref 0.9–3.3)

## 2015-08-13 NOTE — Progress Notes (Signed)
ID: [name]   DOB: March 09, 1956  MR#: [id number]  NTI#:[id number]   CHIEF COMPLAINT: left breast cancer  CURRENT TREATMENT: Tamoxifen daily  BREAST CANCER HISTORY: The patient had a screening mammogram May 18, 2006 at the [facility], which really showed no suspicious findings, but since she was having some pain in the left breast an ultrasound was obtained on January 17.  The area where she was having pain turned out to be unremarkable, but it uncovered a separate area measuring about 4 mm with some spiculation, as well as a second even smaller vague hypoechoic area.  Both of these were suspicious.  The axilla was negative.  With this information, the patient had an ultrasound-guided core biopsy of the larger lesion on June 08, 2006.  This showed an invasive breast cancer, which was initially felt likely to be lobular.  It was strongly ER and PR positive at 96% and 99% with a low proliferation marker at 5%, HER-2/neu equivocal at 2+, but negative by [facility] with a ration of 1.26.  Bilateral breast MRIs were obtained June 24, 2006.  This actually showed 3 different areas of abnormal in the left breast, in different quadrants.  One, of course, was the one that had been previously biopsied. One of the other 2 areas was biopsied under MRI guidance on July 08, 2006, and this showed ([id number] and [id number]) again a strongly ER and PR positive infiltrating breast cancer, which was read initially as a ductal carcinoma, the actual numbers being 97% positivity for the ER and 100% for the PR.  The proliferation marker here was higher at 18%.  The HercepTest was lower at 0.    With this information, after appropriate discussion, Dr. Bubba Hahn proceeded to left mastectomy and sentinel lymph node sampling on August 19, 2006.  The final pathology there ([id number]) showed at least 3 areas of invasive carcinoma, as well as some areas of lobular carcinoma in situ.  The largest invasive component measured 1.7  cm, it was ductal, margins were ample, the tumor grade was 2, and both lymph nodes were negative. Subsequent treatment is as detailed below.  INTERVAL HISTORY: [name] returns today for followup of her breast cancer. Her last visit with [place] was in 2015. She continues on tamoxifen daily and tolerates this well with mild hot flashes but no vaginal changes. The interval history is generally unremarkable for any major events. She has some deep pain to her right breast. And sometime she thinks she can feel a pea sized collection to this breast, but she is unable to locate it today.  REVIEW OF SYSTEMS: [name] denies fevers, chills, nausea, vomiting, or changes in bowel or bladder habits. She has joint aches that are no worse than before. She is short of breath with exertion, but denies chest pain, cough, or palpitations. She wants bariatric weight loss surgery, but is not motivated to lose the weight required to undergo this procedure. She denies headaches, dizziness, or vision changes. A detailed review of system is otherwise stable.   PAST MEDICAL HISTORY: Significant for bladder incontinence, which has been evaluated by Dr. Amalia Hahn, history of migraines, which have been evaluated by Dr. Erling Hahn, history of hypercholesterolemia, history of borderline diabetes, history of obesity, history of GERD, history of what sounds like fibroids in her uterus and history of tobacco abuse with a 9-pack-year history, the patient quitting in 1980.    FAMILY HISTORY The patient's father lives in [place] and she has very little information about him.  The patient's mother, also from [place], is 62 years old.  The patient's mother is 1 of 10 children, including 9 sisters.  Among the patient's mother's 8 sisters, 2 had breast cancer, both diagnosed in their early 76s, one has died, one survives.  The patient herself has multiple half-brothers and half-sisters, but no one with breast or ovarian cancer to the best of her  knowledge.  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  Her husband [name] teaches math at [employer].  Their daughter [name], is Facilities manager in [place].  The patient's daughter [name] is at home.  The patient is a Psychologist, forensic.   ADVANCED DIRECTIVES: not in place  HEALTH MAINTENANCE: Social History  Substance Use Topics  . Smoking status: Former Smoker    Quit date: 06/08/1981  . Smokeless tobacco: Not on file  . Alcohol Use: 0.5 - 1.0 oz/week    1-2 Standard drinks or equivalent per week     Colonoscopy: due  PAP: UTD  Bone density: November 2009/ normal  Lipid panel:  Allergies  Allergen Reactions  . Other Shortness Of Breath    Contrast dye- Throat swelling  . Multihance [Gadobenate Dimeglumine] Hives, Itching and Nausea Only    Pt had breast mri with multihance, was at first nauseated, clammy and dizzy. Then after about 15 minutes had rash,itching and hives. Treated with 68m of benadryl at office and seen by dr. mJobe Hahn . Amoxicillin [Amoxicillin] Swelling  . Kiwi Extract Nausea And Vomiting and Rash    Current Outpatient Prescriptions  Medication Sig Dispense Refill  . aspirin EC 325 MG tablet Take 325 mg by mouth daily. Pt takes two daily    . atorvastatin (LIPITOR) 10 MG tablet Take 10 mg by mouth daily.    . cetirizine (ZYRTEC) 10 MG tablet Take 1 tablet (10 mg total) by mouth at bedtime. 30 tablet 11  . cyclobenzaprine (FLEXERIL) 5 MG tablet TAKE 1- 2 TABLETS BY MOUTH AT BEDTIME AS NEEDED FOR MUSCLE SPASMS 60 tablet 0  . Guaifenesin (MUCINEX MAXIMUM STRENGTH) 1200 MG TB12 Take 1 tablet (1,200 mg total) by mouth every 12 (twelve) hours as needed. 14 tablet 1  . ipratropium (ATROVENT) 0.03 % nasal spray Place 2 sprays into the nose 4 (four) times daily. 30 mL 1  . meloxicam (MOBIC) 15 MG tablet Take 15 mg by mouth daily as needed.    .  metFORMIN (GLUCOPHAGE) 500 MG tablet Take 500 mg by mouth 2 (two) times daily.    . ranitidine (ZANTAC) 150 MG tablet Take 1 tablet (150 mg total) by mouth 2 (two) times daily. 60 tablet 1  . tamoxifen (NOLVADEX) 20 MG tablet TAKE 1 TABLET (20 MG TOTAL) BY MOUTH DAILY. 90 tablet 0  . vitamin E (VITAMIN E) 400 UNIT capsule Take 400 Units by mouth daily.    .Marland Kitchenzinc sulfate 220 MG capsule Take 220 mg by mouth daily.    .Marland Kitchenaspirin (ASPIRIN EC) 81 MG EC tablet Take 81 mg by mouth daily. Reported on 08/13/2015    . diclofenac (VOLTAREN) 75 MG EC tablet Take 75 mg by mouth as needed. Reported on 08/13/2015    . fish oil-omega-3 fatty acids 1000 MG capsule Take 2 g by mouth daily. Reported on 08/13/2015    . Multiple Vitamin (MULTIVITAMIN WITH MINERALS) TABS Take 1 tablet by mouth daily. Reported on 08/13/2015    .  oxybutynin (DITROPAN-XL) 5 MG 24 hr tablet Take 5 mg by mouth daily. Reported on 08/13/2015    . triamcinolone cream (KENALOG) 0.1 % Apply 1 application topically 2 (two) times daily. (Patient not taking: Reported on 08/13/2015) 30 g 0  . VITAMIN D, CHOLECALCIFEROL, PO Take 2,000 Units by mouth. Reported on 08/13/2015     No current facility-administered medications for this visit.   Facility-Administered Medications Ordered in Other Visits  Medication Dose Route Frequency Provider Last Rate Last Dose  . levonorgestrel (MIRENA) 20 MCG/24HR IUD   Intrauterine Once Eldred Manges, MD   1 Intra Uterine Device at 06/19/12 1753    OBJECTIVE: middle-aged African American woman who appears well Filed Vitals:   08/13/15 1358  BP: 154/60  Pulse: 82  Temp: 97.7 F (36.5 C)  Resp: 18     Body mass index is 38.06 kg/(Tracey^2).    ECOG FS: 0  Skin: warm, dry  HEENT: sclerae anicteric, conjunctivae pink, oropharynx clear. No thrush or mucositis.  Lymph Nodes: No cervical or supraclavicular lymphadenopathy  Lungs: clear to auscultation bilaterally, no rales, wheezes, or rhonci  Heart: regular rate and  rhythm  Abdomen: round, soft, non tender, positive bowel sounds  Musculoskeletal: No focal spinal tenderness, no peripheral edema  Neuro: non focal, well oriented, positive affect  Breast: left breast status post mastectomy. No evidence of recurrent disease. Left axilla benign. No abnormalities palpated to right breast. No skin or nipple changes noted. Right axilla benign.  LAB RESULTS: Lab Results  Component Value Date   WBC 6.5 08/13/2015   NEUTROABS 2.8 08/13/2015   HGB 11.8 08/13/2015   HCT 37.3 08/13/2015   MCV 85.4 08/13/2015   PLT 174 08/13/2015      Chemistry      Component Value Date/Time   NA 143 08/13/2015 1455   NA 142 03/20/2012 1844   K 4.1 08/13/2015 1455   K 4.0 03/20/2012 1844   CL 110 03/20/2012 1844   CO2 27 08/13/2015 1455   CO2 27 08/31/2011 1629   BUN 10.7 08/13/2015 1455   BUN 16 03/20/2012 1844   CREATININE 0.8 08/13/2015 1455   CREATININE 0.90 03/20/2012 1844      Component Value Date/Time   CALCIUM 9.2 08/13/2015 1455   CALCIUM 9.4 08/31/2011 1629   ALKPHOS 67 08/13/2015 1455   ALKPHOS 67 08/31/2011 1629   AST 15 08/13/2015 1455   AST 19 08/31/2011 1629   ALT 13 08/13/2015 1455   ALT 11 08/31/2011 1629   BILITOT <0.30 08/13/2015 1455   BILITOT 0.2* 08/31/2011 1629       Lab Results  Component Value Date   LABCA2 17 06/06/2014    No components found for: [id number]  No results for input(s): INR in the last 168 hours.  Urinalysis No results found for: COLORURINE  STUDIES: EXAM: DIGITAL DIAGNOSTIC RIGHT MAMMOGRAM WITH 3D TOMOSYNTHESIS WITH CAD  ULTRASOUND RIGHT BREAST  COMPARISON: Previous exam(s).  ACR Breast Density Category b: There are scattered areas of fibroglandular density.  FINDINGS: There is a biopsy clip in the retroareolar right breast. The parenchymal pattern of the right breast is stable. No mass, distortion, or suspicious microcalcification is identified. Skin thickness is normal. A spot tangential view  of the region of patient concern, as denoted by a metallic skin marker, is negative.  Mammographic images were processed with CAD.  On physical exam, I do not palpate a discrete mass in the retroareolar/periareolar inferior right breast. The breast is soft to  palpation.  Targeted ultrasound is performed, showing normal predominately fatty breast parenchyma in the region of patient concern. No solid or cystic mass or abnormal shadowing is identified.  IMPRESSION: No evidence of malignancy in the right breast. The patient had some persistent concerns about the right breast, after meeting with me today, and she has inquired about breast MRI.  RECOMMENDATION: Screening mammogram of the right breast in 1 year is recommended. Breast MRI could be considered given the patient's personal history of breast cancer, and her apparent persistent concerns in the right breast, after negative mammogram and ultrasound. She states that she will discuss this with Dr.Magrinat.  I have discussed the findings and recommendations with the patient. Results were also provided in writing at the conclusion of the visit. If applicable, a reminder letter will be sent to the patient regarding the next appointment.  BI-RADS CATEGORY 1: Negative.   Electronically Signed  By: Curlene Dolphin Tracey.D.  On: 07/24/2015 14:00  ASSESSMENT: 60 y.o. [name], [place] woman   (1) status post left mastectomy and sentinel lymph node dissection April 2008 for a multicentric T1cN0, stage IA, invasive ductal carcinoma, grade 2, strongly estrogen and progesterone receptor positive, HER-2 negative, with a low proliferation fraction and a low Oncotype DX score.    (2) She has been on tamoxifen since April 2008 with good tolerance.  PLAN:  [name] is doing well as far as her breast cancer is concerned. She is almost 9 years out from her definitive surgery with no evidence of recurrent disease. Her most recent mammogram and  ultrasound were negative, but she has her doubts. Her left breast cancer started out with the same deep pain and pulling that she is now experiencing on the right side, and it was overlooked on that mammogram. A breast MRI would be more sensitive, and she is asking to have one ordered. I do not mind doing so, but she understands that her insurance company may deny my request. In any case, an order was placed during our visit today.   [name] will return in 1 year for labs and a follow up visit with Dr. Jana Hakim. This will be her 10 year anniversary of her definitive surgery. During this time they will discuss "graduating" from follow up visits and discontinuing antiestrogen therapy with tamoxifen. She understands and agrees with this plan. She knows the goal of treatment in her case is cure. She has been encouraged to call with any issues that might arise before her next visit her.e   Laurie Panda    08/13/2015

## 2015-08-13 NOTE — Telephone Encounter (Signed)
appt made and avs printed. Sent back to lab today per 3/29 pof

## 2015-08-18 ENCOUNTER — Other Ambulatory Visit: Payer: Self-pay | Admitting: *Deleted

## 2015-08-18 DIAGNOSIS — C50912 Malignant neoplasm of unspecified site of left female breast: Secondary | ICD-10-CM

## 2015-08-19 ENCOUNTER — Encounter: Payer: Self-pay | Admitting: Physical Therapy

## 2015-08-19 ENCOUNTER — Ambulatory Visit: Payer: BC Managed Care – PPO | Attending: Family Medicine | Admitting: Physical Therapy

## 2015-08-19 DIAGNOSIS — M545 Low back pain, unspecified: Secondary | ICD-10-CM

## 2015-08-19 DIAGNOSIS — M25611 Stiffness of right shoulder, not elsewhere classified: Secondary | ICD-10-CM

## 2015-08-19 DIAGNOSIS — R531 Weakness: Secondary | ICD-10-CM | POA: Diagnosis present

## 2015-08-19 DIAGNOSIS — M542 Cervicalgia: Secondary | ICD-10-CM | POA: Diagnosis not present

## 2015-08-19 DIAGNOSIS — M25612 Stiffness of left shoulder, not elsewhere classified: Secondary | ICD-10-CM

## 2015-08-19 DIAGNOSIS — M6281 Muscle weakness (generalized): Secondary | ICD-10-CM | POA: Diagnosis present

## 2015-08-19 NOTE — Therapy (Signed)
[place] MAIN [facility] SERVICES [address] [place], [place], [address] Phone: [phone]   Fax:  [phone]  Physical Therapy Treatment  Patient Details  Name: [name] MRN: [id number] Date of Birth: Oct 01, 1955 Referring Provider: Delman Cheadle MD  Encounter Date: 08/19/2015      PT End of Session - 08/19/15 1624    Visit Number 2   Number of Visits 13   Date for PT Re-Evaluation 09/10/15   PT Start Time 1600   PT Stop Time 1645   PT Time Calculation (min) 45 min   Activity Tolerance Patient limited by pain   Behavior During Therapy [facility] for tasks assessed/performed      Past Medical History  Diagnosis Date  . Abnormal Pap smear 05/25/1991  . Headache(784.0)   . Varicose veins     Mother  . Anemia   . Bladder infection   . H/O menorrhagia   . Oligomenorrhea     h/o  . Ovarian cyst     h/o asymptomatic  . H/O hyperlipidemia   . Pain, female pelvic     h/o  . Fibroids     h/o  . Diabetes mellitus without complication ([place])   . Breast cancer, left ([place])     survivor; remission since 2008  . Depression 07/1991    postpartum depression; non currently;     Past Surgical History  Procedure Laterality Date  . Lysis of adhesions  Resolved 10/26/90    For pelvic adhesions  . Colposcopy  06/1991  . Tubal ligation  1997  . Mastectomy  2008    left breast  . Hysteroscopy w/d&c  07/20/2007  . Diagnostic laparoscopy  1994  . Breast surgery      There were no vitals filed for this visit.  Visit Diagnosis:  Neck pain  Bilateral low back pain without sciatica  Weakness  Shoulder stiffness, left  Shoulder stiffness, right      Subjective Assessment - 08/19/15 1618    Subjective Patient reports increased neck pain only with quick movement; She has no pain with slow movement to end range; she denies any UE pain; She reports still having trouble with driving;    Pertinent History recently diagnosed with diabetes,  chronicity of pain, obesity   How long can you sit comfortably? 20-30 min;    How long can you stand comfortably? 20-30 min;    How long can you walk comfortably? walks at slower pace; able to walk >500 feet;    Diagnostic tests not sure;    Patient Stated Goals be able to lift arms without pain;    Currently in Pain? Yes   Pain Score 4    Pain Location Neck   Pain Orientation Right;Left   Pain Descriptors / Indicators Sore;Throbbing;Tightness  with movement   Pain Type Chronic pain   Pain Onset More than a month ago      TREATMENT:  PT performed extensive manual therapy: Prone: PT performed grade I-II PA mobs along T7, T6, T5, T4, T3, T2, T1, C7, C6, C5, C4, C3 10 sec bouts x2 each; Patient reports severe tenderness along T2, T3, T4 with gentle joint mobs; she was able to exhibit improved joint mobility with less tightness after joint mobs;  Seated: Cervical extension AROM x10 reps; Cervical rotation x5 each direction with cues to turn slowly and to increase ROM:  PT educated patient in HEP: Standing with red tband in BUE: BUE rows x10, BUE  shoulder extension x10  With cues to increase scapular retraction and reduce shoulder elevation;  Doorway stretch 15 sec hold x2; Backwards shoulder rolls x15 with cues to focus on pulling shoulders back and relaxing into depression;   Seated chin tucks 5 sec hold x5 with cues to avoid cervical flexion or extension and focus on straight retraction;    Provided handout for HEP compliance;                           PT Education - 08/19/15 1624    Education provided Yes   Education Details HEP, posture, ROM exercise   Person(s) Educated Patient   Methods Explanation;Verbal cues;Handout   Comprehension Verbalized understanding;Returned demonstration;Verbal cues required             PT Long Term Goals - 07/30/15 0912    PT LONG TERM GOAL #1   Title Patient will be independent in home exercise program to  improve strength/mobility for better functional independence with ADLs. by 09/10/15   Time 6   Period Weeks   Status New   PT LONG TERM GOAL #2   Title Patient (> 42 years old) will complete five times sit to stand test in < 15 seconds indicating an increased LE strength and improved balance. by 09/10/15   Time 6   Period Weeks   Status New   PT LONG TERM GOAL #3   Title  Patient will be independent with ascend/descend 12 steps using single UE in step over step pattern without LOB with minimal difficulty to improve ability to get to bedroom in home by 09/10/15   Time 6   Period Weeks   Status New   PT LONG TERM GOAL #4   Title Patient will increase BLE gross strength to 4+/5 as to improve functional strength for independent gait, increased standing tolerance and increased ADL ability. by 09/10/15   Time 6   Period Weeks   Status New   PT LONG TERM GOAL #5   Title Patient will report a worst pain of 3/10 on VAS in  neck and back    to improve tolerance with ADLs and reduced symptoms with activities. by 09/10/15   Time 6   Period Weeks   Status New   Additional Long Term Goals   Additional Long Term Goals Yes   PT LONG TERM GOAL #6   Title Patient will increase BUE shoulder AROM to >130 degrees flexion/abduction without increase in pain for increased each of overhead lifting and return to PLOF by 09/10/15   Time 6   Period Weeks   Status New               Plan - 08/19/15 1636    Clinical Impression Statement Patient instructed in cervical ROM and postural strengthening; She required min VCs for correct exercise technique; PT also performed manual therapy for increased tissue extensibility; She was very tender to palpaiton being able to only tolerate grade I-II mobs; Patient does have a lot of thoracic tightness. She was carrying heavy bags (approximately 20#) in UE without difficulty; PT concerned about different verbal reports to actual performance. She would benefit from  additional skilled PT Intervention to improve flexibility and reduce pain;    Pt will benefit from skilled therapeutic intervention in order to improve on the following deficits Decreased endurance;Obesity;Decreased activity tolerance;Decreased strength;Pain;Increased muscle spasms;Difficulty walking;Decreased balance;Decreased mobility;Improper body mechanics;Decreased range of motion;Postural dysfunction;Impaired flexibility;Decreased safety awareness  Rehab Potential Good   Clinical Impairments Affecting Rehab Potential positive: motivation, caregiver support, negative: chronicity of pain; Patient's clinical presentation is evolving as her pain fluctuates from her neck to her back and comes and goes;    PT Frequency 2x / week   PT Duration 6 weeks   PT Treatment/Interventions ADLs/Self Care Home Management;Cryotherapy;Electrical Stimulation;Moist Heat;Traction;Balance training;Manual techniques;Therapeutic exercise;Therapeutic activities;Functional mobility training;Stair training;Gait training;Ultrasound;Neuromuscular re-education;Patient/family education;Energy conservation;Dry needling;Passive range of motion   PT Next Visit Plan advance  HEP working on UE/neck and back flexibility;    PT Home Exercise Plan initiated HEP, see patient instructions;    Consulted and Agree with Plan of Care Patient        Problem List Patient Active Problem List   Diagnosis Date Noted  . Ovarian cyst   . H/O hyperlipidemia   . Pain, female pelvic   . Fibroids   . Pelvic pain 09/27/2011  . S/P endometrial ablation 09/27/2011  . Chronic PID (chronic pelvic inflammatory disease) 09/23/2011  . Pelvic adhesions 09/23/2011  . Spasm of colon 09/23/2011  . Breast cancer, left breast ([place]) 09/23/2011    Mahlani Berninger PT, DPT 08/19/2015, 4:39 PM  [place] MAIN [facility] SERVICES [address] [place], [place], [address] Phone: [phone]   Fax:  [phone]  Name:  [name] MRN: [id number] Date of Birth: 03-Aug-1955

## 2015-08-19 NOTE — Patient Instructions (Addendum)
.    Shoulder Retraction   Tie band around door knob (sitting or standing, holding band in both hands) Facing chest height anchor, grasp ends of band and pull hands to chest, squeezing shoulder blades together. Hold _3-5seconds. Repeat _10 times. Do _2_ sessions per day. Safety Note: Be sure anchor is secure.  Copyright  VHI. All rights reserved.  Strengthening: Resisted Extension   Hold band in both hands, Pull arm back, elbow straight, squeezing shoulder blades, Repeat _10___ times per set. Do _2___ sets per session. Do __1__ sessions per day.  http://orth.exer.us/833   Copyright  VHI. All rights reserved.     Roll   Inhale and bring shoulders up, back, then exhale and relax shoulders down. Repeat _10__ times. Do _2-3__ times per day.  Copyright  VHI. All rights reserved.    Head Rotation   Slowly turn head to one side, then the other. Hold _3__ seconds. Repeat _10__ times each side, alternating. Do __2_ sessions per day.  Copyright  VHI. All rights reserved.  Neck Side-Bending   Begin with chin level, head centered over spine. Slowly lower one ear toward shoulder. Hold __3__ seconds. Slowly return to starting position. Repeat to other side. Repeat __10__ times to each side.  Copyright  VHI. All rights reserved.    Copyright  VHI. All rights reserved.  Neck Flexion   Begin with chin level, head centered over spine. Slowly lower chin toward chest. Hold _3___ seconds. Slowly return to starting position. Repeat __10__ times.  Copyright  VHI. All rights reserved.  AROM: Neck Extension   Bend head backward. Hold _3___ seconds. Repeat _10___ times per set. Do __1__ sets per session. Do _2__ sessions per day.  http://orth.exer.us/301    Neck: Retraction   Sit with back and head straight. Pull chin back to line up ear with shoulder. Do not turn or tilt head. You can use your hand to help if needed. Hold _5___ seconds. Repeat _10___ times. Do __2__  sessions per day. CAUTION: Movement should be gentle, steady and slow.  Copyright  VHI. All rights reserved.   CHEST: Doorway, Bilateral - Standing     Step forward into doorway until you feel stretch into chest;  Standing in doorway, place hands on wall with elbows bent at shoulder height. Lean forward. Hold _15__ seconds. 2-3___ reps per set, __2_ sets per day, _5__ days per week Copyright  VHI. All rights reserved.

## 2015-08-21 ENCOUNTER — Ambulatory Visit: Payer: BC Managed Care – PPO | Admitting: Physical Therapy

## 2015-08-22 ENCOUNTER — Other Ambulatory Visit: Payer: Self-pay | Admitting: *Deleted

## 2015-08-22 ENCOUNTER — Telehealth: Payer: Self-pay | Admitting: *Deleted

## 2015-08-22 NOTE — Telephone Encounter (Signed)
Called pt and left message that schedulers have been trying to call to get her MRI set up but have had a response back from her. I left this detailed message to her VM and told her to call this nurse back [phone]. Message to be fwd to H. Boelter,NP.

## 2015-08-26 ENCOUNTER — Other Ambulatory Visit: Payer: Self-pay | Admitting: *Deleted

## 2015-08-26 ENCOUNTER — Encounter: Payer: Self-pay | Admitting: Physical Therapy

## 2015-08-26 ENCOUNTER — Telehealth: Payer: Self-pay | Admitting: *Deleted

## 2015-08-26 ENCOUNTER — Ambulatory Visit: Payer: BC Managed Care – PPO | Admitting: Physical Therapy

## 2015-08-26 DIAGNOSIS — M542 Cervicalgia: Secondary | ICD-10-CM | POA: Diagnosis not present

## 2015-08-26 DIAGNOSIS — M545 Low back pain, unspecified: Secondary | ICD-10-CM

## 2015-08-26 DIAGNOSIS — M25612 Stiffness of left shoulder, not elsewhere classified: Secondary | ICD-10-CM

## 2015-08-26 DIAGNOSIS — C50912 Malignant neoplasm of unspecified site of left female breast: Secondary | ICD-10-CM

## 2015-08-26 DIAGNOSIS — M6281 Muscle weakness (generalized): Secondary | ICD-10-CM

## 2015-08-26 DIAGNOSIS — N644 Mastodynia: Secondary | ICD-10-CM

## 2015-08-26 DIAGNOSIS — M25611 Stiffness of right shoulder, not elsewhere classified: Secondary | ICD-10-CM

## 2015-08-26 NOTE — Telephone Encounter (Signed)
Left message to pt's VM concerning MRI Breast appt letting her know that her appt is April 26 @1 :00p at [facility]. Message to be fwd to [employer].

## 2015-08-26 NOTE — Therapy (Signed)
[place] MAIN [facility] SERVICES [address] [place], [place], [address] Phone: [phone]   Fax:  [phone]  Physical Therapy Treatment  Patient Details  Name: [name] MRN: [id number] Date of Birth: Oct 06, 1955 Referring Provider: Delman Cheadle MD  Encounter Date: 08/26/2015      PT End of Session - 08/26/15 1625    Visit Number 3   Number of Visits 13   Date for PT Re-Evaluation 09/23/15   PT Start Time 1600   PT Stop Time 1645   PT Time Calculation (min) 45 min   Activity Tolerance Patient tolerated treatment well   Behavior During Therapy [facility] for tasks assessed/performed      Past Medical History  Diagnosis Date  . Abnormal Pap smear 05/25/1991  . Headache(784.0)   . Varicose veins     Mother  . Anemia   . Bladder infection   . H/O menorrhagia   . Oligomenorrhea     h/o  . Ovarian cyst     h/o asymptomatic  . H/O hyperlipidemia   . Pain, female pelvic     h/o  . Fibroids     h/o  . Diabetes mellitus without complication ([place])   . Breast cancer, left ([place])     survivor; remission since 2008  . Depression 07/1991    postpartum depression; non currently;     Past Surgical History  Procedure Laterality Date  . Lysis of adhesions  Resolved 10/26/90    For pelvic adhesions  . Colposcopy  06/1991  . Tubal ligation  1997  . Mastectomy  2008    left breast  . Hysteroscopy w/d&c  07/20/2007  . Diagnostic laparoscopy  1994  . Breast surgery      There were no vitals filed for this visit.      Subjective Assessment - 08/26/15 1606    Subjective Patient reports, "My pain isn't too bad today." She reports compliance with HEP but left her exercise sheets at work. She reports only feeling slight pain with quick head turns; She reports increased stiffness in neck and upper back;    Pertinent History recently diagnosed with diabetes, chronicity of pain, obesity   How long can you sit comfortably? 20-30 min;    How long  can you stand comfortably? 20-30 min;    How long can you walk comfortably? walks at slower pace; able to walk >500 feet;    Diagnostic tests not sure;    Patient Stated Goals be able to lift arms without pain;    Currently in Pain? No/denies   Pain Onset More than a month ago      TREATMENT: PT performed extensive manual therapy: Prone: PT performed grade I-II PA mobs along T7, T6, T5, T4, T3, T2, T1,  10 sec bouts x2 each; Patient reports severe tenderness along T2, T3, T4 with gentle joint mobs; she was able to exhibit improved joint mobility with less tightness after joint mobs; Patient also exhibits good myofascial movement and good soft tissue mobility during manual assessment in the cervical/thoracic spine;  Patient prone: BUE shoulder extension 2# x10 with cues to keep elbows straight and to avoid painful ROM; Single UE low row 2# x10 bilaterally with cues for positioning for less discomfort and to increase scapular retraction for better upper back support;  Mid row 2# x10 bilaterally with cues for scapular retraction and positioning;  Patient had increased difficulty and discomfort at end range with prone exercise due to  weakness and fatigue;  Prone on elbows, press up with scapular protraction x10 reps with mod VCs to relax for better scapular movement;  Standing doorway stretch 15 sec hold x2 with cues for positioning and to relax shoulder for less elevation;  Pball on wall: Up/down with overpressure into flexion for shoulder stretch x5 reps; Pball push ups with cues for less scapular winging x10 reps;  PT instructed patient in standing scapular protraction with red tband but patient unable to perform due to scapular tightness and lack of coordination;  Had patient supine: Single UE serratus punches 2# x15 bilaterally with tactile cues to keep elbow straight; Single UE D2 flexion with cues for scapular retraction red tband x10 bilaterally;  Sitting: BUE thoracic flexion  stretch 5 sec hold x3; BUE "W" scapular retraction stretch 5 sec hold x3;  Patient required min-moderate verbal/tactile cues for correct exercise technique with all exercise;                            PT Education - 08/26/15 1624    Education provided Yes   Education Details UE strengthening, posture, manual therapy;    Person(s) Educated Patient   Methods Explanation;Verbal cues   Comprehension Verbalized understanding;Returned demonstration;Verbal cues required             PT Long Term Goals - 08/26/15 1645    PT LONG TERM GOAL #1   Title Patient will be independent in home exercise program to improve strength/mobility for better functional independence with ADLs. by 09/23/15   Time 6   Period Weeks   Status On-going   PT LONG TERM GOAL #2   Title Patient (60 years old) will complete five times sit to stand test in < 15 seconds indicating an increased LE strength and improved balance. by 09/23/15   Time 6   Period Weeks   Status On-going   PT LONG TERM GOAL #3   Title  Patient will be independent with ascend/descend 12 steps using single UE in step over step pattern without LOB with minimal difficulty to improve ability to get to bedroom in home by 09/23/15   Time 6   Period Weeks   Status On-going   PT LONG TERM GOAL #4   Title Patient will increase BLE gross strength to 4+/5 as to improve functional strength for independent gait, increased standing tolerance and increased ADL ability. by 09/23/15   Time 6   Period Weeks   Status On-going   PT LONG TERM GOAL #5   Title Patient will report a worst pain of 3/10 on VAS in  neck and back    to improve tolerance with ADLs and reduced symptoms with activities. by 09/23/15   Time 6   Period Weeks   Status On-going   PT LONG TERM GOAL #6   Title Patient will increase BUE shoulder AROM to >130 degrees flexion/abduction without increase in pain for increased each of overhead lifting and return to PLOF  by 09/23/15   Time 6   Period Weeks   Status On-going               Plan - 08/26/15 1643    Clinical Impression Statement Patient instructed in scapular retraction and protraction for better scapular mobility and less stiffness. Patient continues to have sensitivity with manual therapy but was able to exhibit better joint mobility with less stiffness during joint mobs. She responded well to exercise without  increase in pain. Patient would benefit from additional skilled PT Intervention to improve postural strength and reduce discomfort;    Rehab Potential Good   Clinical Impairments Affecting Rehab Potential positive: motivation, caregiver support, negative: chronicity of pain; Patient's clinical presentation is evolving as her pain fluctuates from her neck to her back and comes and goes;    PT Frequency 1x / week   PT Duration 4 weeks   PT Treatment/Interventions ADLs/Self Care Home Management;Cryotherapy;Electrical Stimulation;Moist Heat;Traction;Balance training;Manual techniques;Therapeutic exercise;Therapeutic activities;Functional mobility training;Stair training;Gait training;Ultrasound;Neuromuscular re-education;Patient/family education;Energy conservation;Dry needling;Passive range of motion   PT Next Visit Plan advance  HEP working on UE/neck and back flexibility;    PT Home Exercise Plan initiated HEP, see patient instructions;    Consulted and Agree with Plan of Care Patient      Patient will benefit from skilled therapeutic intervention in order to improve the following deficits and impairments:  Decreased endurance, Obesity, Decreased activity tolerance, Decreased strength, Pain, Increased muscle spasms, Difficulty walking, Decreased balance, Decreased mobility, Improper body mechanics, Decreased range of motion, Postural dysfunction, Impaired flexibility, Decreased safety awareness  Visit Diagnosis: Cervicalgia - Plan: PT plan of care cert/re-cert  Bilateral low back  pain without sciatica - Plan: PT plan of care cert/re-cert  Muscle weakness (generalized) - Plan: PT plan of care cert/re-cert  Stiffness of right shoulder, not elsewhere classified - Plan: PT plan of care cert/re-cert  Stiffness of left shoulder, not elsewhere classified - Plan: PT plan of care cert/re-cert     Problem List Patient Active Problem List   Diagnosis Date Noted  . Ovarian cyst   . H/O hyperlipidemia   . Pain, female pelvic   . Fibroids   . Pelvic pain 09/27/2011  . S/P endometrial ablation 09/27/2011  . Chronic PID (chronic pelvic inflammatory disease) 09/23/2011  . Pelvic adhesions 09/23/2011  . Spasm of colon 09/23/2011  . Breast cancer, left breast ([place]) 09/23/2011    Trotter,Margaret PT, DPT 08/26/2015, 4:48 PM  [place] MAIN [facility] SERVICES [address] [place], [place], [address] Phone: [phone]   Fax:  [phone]  Name: LEDDY CARDIFF MRN: [id number] Date of Birth: 08/05/1955

## 2015-08-28 ENCOUNTER — Ambulatory Visit: Payer: BC Managed Care – PPO | Admitting: Physical Therapy

## 2015-09-02 ENCOUNTER — Ambulatory Visit: Payer: BC Managed Care – PPO | Admitting: Physical Therapy

## 2015-09-02 ENCOUNTER — Encounter: Payer: Self-pay | Admitting: Physical Therapy

## 2015-09-02 DIAGNOSIS — M6281 Muscle weakness (generalized): Secondary | ICD-10-CM

## 2015-09-02 DIAGNOSIS — M25611 Stiffness of right shoulder, not elsewhere classified: Secondary | ICD-10-CM

## 2015-09-02 DIAGNOSIS — M542 Cervicalgia: Secondary | ICD-10-CM | POA: Diagnosis not present

## 2015-09-02 DIAGNOSIS — M545 Low back pain, unspecified: Secondary | ICD-10-CM

## 2015-09-02 DIAGNOSIS — M25612 Stiffness of left shoulder, not elsewhere classified: Secondary | ICD-10-CM

## 2015-09-02 NOTE — Therapy (Signed)
[place] MAIN [facility] SERVICES [address] [place], [place], [address] Phone: [phone]   Fax:  [phone]  Physical Therapy Treatment  Patient Details  Name: [name] MRN: [id number] Date of Birth: 1956/02/21 Referring Provider: Delman Cheadle MD  Encounter Date: 09/02/2015      PT End of Session - 09/02/15 1628    Visit Number 4   Number of Visits 13   Date for PT Re-Evaluation 09/23/15   PT Start Time [id number]   PT Stop Time 1700   PT Time Calculation (min) 50 min   Activity Tolerance Patient tolerated treatment well;No increased pain   Behavior During Therapy [facility] for tasks assessed/performed      Past Medical History  Diagnosis Date  . Abnormal Pap smear 05/25/1991  . Headache(784.0)   . Varicose veins     Mother  . Anemia   . Bladder infection   . H/O menorrhagia   . Oligomenorrhea     h/o  . Ovarian cyst     h/o asymptomatic  . H/O hyperlipidemia   . Pain, female pelvic     h/o  . Fibroids     h/o  . Diabetes mellitus without complication ([place])   . Breast cancer, left ([place])     survivor; remission since 2008  . Depression 07/1991    postpartum depression; non currently;     Past Surgical History  Procedure Laterality Date  . Lysis of adhesions  Resolved 10/26/90    For pelvic adhesions  . Colposcopy  06/1991  . Tubal ligation  1997  . Mastectomy  2008    left breast  . Hysteroscopy w/d&c  07/20/2007  . Diagnostic laparoscopy  1994  . Breast surgery      There were no vitals filed for this visit.      Subjective Assessment - 09/02/15 1617    Subjective Patient reports, "I have been rushing today." Patient reports increased back pain today; She asked, "Can I Start back with my exercise like I was before the accident? I haven't done that."   Pertinent History recently diagnosed with diabetes, chronicity of pain, obesity   How long can you sit comfortably? 20-30 min;    How long can you stand comfortably?  20-30 min;    How long can you walk comfortably? walks at slower pace; able to walk >500 feet;    Diagnostic tests not sure;    Patient Stated Goals be able to lift arms without pain;    Currently in Pain? Yes   Pain Score 5    Pain Location Back   Pain Orientation Mid;Lower   Pain Descriptors / Indicators Sore;Tightness   Pain Type Chronic pain   Pain Onset More than a month ago          TREATMENT: Warm up on UBE, BUE only level 2 Backwards only x2 min (Unbilled);  Instructed patient in lumbar flexion exercise to reduce low back pain: Hooklying: Lumbar trunk rotation 2x1 min with cues to avoid painful ROM; Single knee to chest 10 sec hold x3 bilaterally; Patient required min VCs to avoid painful ROM;  Posterior pelvic tilt 5 sec hold x10 with cues to utilize diaphragmatic breathing for better core stabilization;  Educated patient in log roll technique with cues to avoid twisting back;  Prone: BUE 2# shoulder extension 2x10 with cues for technique; BUE 2# low row 2x12 with cues for scapular retraction BUE 2# mid row 2 x10 with cues  for scapular retraction;  HOIST mid row, plate #2 [id number]; required mod tactile and verbal cues for scapular retraction;  HOIST lat pull down plate #2 [id number] with cues for technique and to increase scapular retraction/depression;   Qped: Cat/camel stretch with cues to avoid shoulder/hip movement and isolate thoracic mobility x10 reps;  Facing wall: Scapular protraction x10 reps;  Doorway stretch 15 sec x2 lower pec, x2 upper pec;  Patient reports increased fatigue at end of treatment session; She has less pain after treatment session;                         PT Education - 09/02/15 1628    Education provided Yes   Education Details postural strengthening, core stabilization;    Person(s) Educated Patient   Methods Explanation;Verbal cues   Comprehension Verbalized understanding;Returned demonstration;Verbal cues required              PT Long Term Goals - 08/26/15 1645    PT LONG TERM GOAL #1   Title Patient will be independent in home exercise program to improve strength/mobility for better functional independence with ADLs. by 09/23/15   Time 6   Period Weeks   Status On-going   PT LONG TERM GOAL #2   Title Patient (> 29 years old) will complete five times sit to stand test in < 15 seconds indicating an increased LE strength and improved balance. by 09/23/15   Time 6   Period Weeks   Status On-going   PT LONG TERM GOAL #3   Title  Patient will be independent with ascend/descend 12 steps using single UE in step over step pattern without LOB with minimal difficulty to improve ability to get to bedroom in home by 09/23/15   Time 6   Period Weeks   Status On-going   PT LONG TERM GOAL #4   Title Patient will increase BLE gross strength to 4+/5 as to improve functional strength for independent gait, increased standing tolerance and increased ADL ability. by 09/23/15   Time 6   Period Weeks   Status On-going   PT LONG TERM GOAL #5   Title Patient will report a worst pain of 3/10 on VAS in  neck and back    to improve tolerance with ADLs and reduced symptoms with activities. by 09/23/15   Time 6   Period Weeks   Status On-going   PT LONG TERM GOAL #6   Title Patient will increase BUE shoulder AROM to >130 degrees flexion/abduction without increase in pain for increased each of overhead lifting and return to PLOF by 09/23/15   Time 6   Period Weeks   Status On-going               Plan - 09/02/15 1701    Clinical Impression Statement Patient instructed in scapular strengthening for increased postural strength; She did require increased cues for correct technique; Introduced lumbar flexion exercise for increased lumbar mobility; Patient continues to report increased pain but was able to perform each exercise without grimace or shortness of breath; She would benefit from additional skilled PT  intervention to improve strength and reduce back pain;    Rehab Potential Good   Clinical Impairments Affecting Rehab Potential positive: motivation, caregiver support, negative: chronicity of pain; Patient's clinical presentation is evolving as her pain fluctuates from her neck to her back and comes and goes;    PT Frequency 1x / week   PT Duration 4  weeks   PT Treatment/Interventions ADLs/Self Care Home Management;Cryotherapy;Electrical Stimulation;Moist Heat;Traction;Balance training;Manual techniques;Therapeutic exercise;Therapeutic activities;Functional mobility training;Stair training;Gait training;Ultrasound;Neuromuscular re-education;Patient/family education;Energy conservation;Dry needling;Passive range of motion   PT Next Visit Plan advance  HEP working on UE/neck and back flexibility;    PT Home Exercise Plan continue as previously given;    Consulted and Agree with Plan of Care Patient      Patient will benefit from skilled therapeutic intervention in order to improve the following deficits and impairments:  Decreased endurance, Obesity, Decreased activity tolerance, Decreased strength, Pain, Increased muscle spasms, Difficulty walking, Decreased balance, Decreased mobility, Improper body mechanics, Decreased range of motion, Postural dysfunction, Impaired flexibility, Decreased safety awareness  Visit Diagnosis: Cervicalgia  Bilateral low back pain without sciatica  Muscle weakness (generalized)  Stiffness of right shoulder, not elsewhere classified  Stiffness of left shoulder, not elsewhere classified     Problem List Patient Active Problem List   Diagnosis Date Noted  . Ovarian cyst   . H/O hyperlipidemia   . Pain, female pelvic   . Fibroids   . Pelvic pain 09/27/2011  . S/P endometrial ablation 09/27/2011  . Chronic PID (chronic pelvic inflammatory disease) 09/23/2011  . Pelvic adhesions 09/23/2011  . Spasm of colon 09/23/2011  . Breast cancer, left breast  ([place]) 09/23/2011    Trotter,Margaret PT, DPT 09/02/2015, 5:03 PM  [place] MAIN [facility] SERVICES [address] [place], [place], [address] Phone: [phone]   Fax:  [phone]  Name: [name] MRN: [id number] Date of Birth: [birth year]

## 2015-09-04 ENCOUNTER — Encounter: Payer: Self-pay | Admitting: Physical Therapy

## 2015-09-09 ENCOUNTER — Encounter: Payer: Self-pay | Admitting: Physical Therapy

## 2015-09-09 ENCOUNTER — Ambulatory Visit: Payer: BC Managed Care – PPO | Admitting: Physical Therapy

## 2015-09-09 DIAGNOSIS — M25612 Stiffness of left shoulder, not elsewhere classified: Secondary | ICD-10-CM

## 2015-09-09 DIAGNOSIS — M6281 Muscle weakness (generalized): Secondary | ICD-10-CM

## 2015-09-09 DIAGNOSIS — M542 Cervicalgia: Secondary | ICD-10-CM | POA: Diagnosis not present

## 2015-09-09 DIAGNOSIS — M25611 Stiffness of right shoulder, not elsewhere classified: Secondary | ICD-10-CM

## 2015-09-09 DIAGNOSIS — M545 Low back pain, unspecified: Secondary | ICD-10-CM

## 2015-09-09 NOTE — Therapy (Signed)
[place] MAIN [facility] SERVICES [address] [place], [place], [address] Phone: [phone]   Fax:  [phone]  Physical Therapy Treatment  Patient Details  Name: [name] MRN: [id number] Date of Birth: 25-Jan-1956 Referring Provider: Delman Cheadle MD  Encounter Date: 09/09/2015      PT End of Session - 09/09/15 1646    Visit Number 5   Number of Visits 13   Date for PT Re-Evaluation 09/23/15   PT Start Time [id number]   PT Stop Time [id number]   PT Time Calculation (min) 30 min   Activity Tolerance Patient tolerated treatment well;No increased pain   Behavior During Therapy [facility] for tasks assessed/performed      Past Medical History  Diagnosis Date  . Abnormal Pap smear 05/25/1991  . Headache(784.0)   . Varicose veins     Mother  . Anemia   . Bladder infection   . H/O menorrhagia   . Oligomenorrhea     h/o  . Ovarian cyst     h/o asymptomatic  . H/O hyperlipidemia   . Pain, female pelvic     h/o  . Fibroids     h/o  . Diabetes mellitus without complication ([place])   . Breast cancer, left ([place])     survivor; remission since 2008  . Depression 07/1991    postpartum depression; non currently;     Past Surgical History  Procedure Laterality Date  . Lysis of adhesions  Resolved 10/26/90    For pelvic adhesions  . Colposcopy  06/1991  . Tubal ligation  1997  . Mastectomy  2008    left breast  . Hysteroscopy w/d&c  07/20/2007  . Diagnostic laparoscopy  1994  . Breast surgery      There were no vitals filed for this visit.      Subjective Assessment - 09/09/15 1616    Subjective Patient reports, 'Its raining a lot outside. Its just  miserable" Patient reports increased discomfort in bilateral upper trap area today stating, "Its hurting. I don't know I guess around here" she states as she points to her upper traps;    Pertinent History recently diagnosed with diabetes, chronicity of pain, obesity   How long can you sit comfortably?  20-30 min;    How long can you stand comfortably? 20-30 min;    How long can you walk comfortably? walks at slower pace; able to walk >500 feet;    Diagnostic tests not sure;    Patient Stated Goals be able to lift arms without pain;    Currently in Pain? Yes   Pain Score 4    Pain Location Back   Pain Orientation Upper   Pain Descriptors / Indicators Sore;Tightness   Pain Type Chronic pain   Pain Onset More than a month ago            TREATMENT: Warm up on UBE, BUE only level 2 Backwards only x2 min (Unbilled);  Prone: BUE 2# shoulder extension 2x12 with cues for technique; BUE 2# low row 2x12 with cues for scapular retraction and to slow down UE movement; BUE 2# shoulder horizontal abduction "T" 2x12 with cues for scapular retraction;  HOIST mid row, plate #2 [id number]; required mod tactile and verbal cues for scapular retraction;  HOIST lat pull down plate #2 [id number] with cues for technique and to increase scapular retraction/depression;   Qped: Cat/camel stretch with cues to avoid shoulder/hip movement and isolate thoracic mobility x5 reps;  Facing wall: Wall Push ups x10 with cues to step farther back away from wall for increased challenge;  Red tband horizontal abduction x10 reps with cues to keep elbow straight for better strengthening;  Doorway stretch 15 sec x2 with cues to increase hold time for better stretch;   Patient reports increased fatigue at end of treatment session; She has less pain after treatment session;                           PT Education - 09/09/15 1644    Education provided Yes   Education Details postural strengthening, HEP   Person(s) Educated Patient   Methods Explanation;Verbal cues   Comprehension Verbalized understanding;Returned demonstration;Verbal cues required             PT Long Term Goals - 08/26/15 1645    PT LONG TERM GOAL #1   Title Patient will be independent in home exercise program to improve  strength/mobility for better functional independence with ADLs. by 09/23/15   Time 6   Period Weeks   Status On-going   PT LONG TERM GOAL #2   Title Patient (> 16 years old) will complete five times sit to stand test in < 15 seconds indicating an increased LE strength and improved balance. by 09/23/15   Time 6   Period Weeks   Status On-going   PT LONG TERM GOAL #3   Title  Patient will be independent with ascend/descend 12 steps using single UE in step over step pattern without LOB with minimal difficulty to improve ability to get to bedroom in home by 09/23/15   Time 6   Period Weeks   Status On-going   PT LONG TERM GOAL #4   Title Patient will increase BLE gross strength to 4+/5 as to improve functional strength for independent gait, increased standing tolerance and increased ADL ability. by 09/23/15   Time 6   Period Weeks   Status On-going   PT LONG TERM GOAL #5   Title Patient will report a worst pain of 3/10 on VAS in  neck and back    to improve tolerance with ADLs and reduced symptoms with activities. by 09/23/15   Time 6   Period Weeks   Status On-going   PT LONG TERM GOAL #6   Title Patient will increase BUE shoulder AROM to >130 degrees flexion/abduction without increase in pain for increased each of overhead lifting and return to PLOF by 09/23/15   Time 6   Period Weeks   Status On-going               Plan - 09/09/15 1646    Clinical Impression Statement Patient was late to treatment session; She continues to require cues to increase scapular retraction with advanced exercise. Patient reports still feeling neck and back pain, however is able to do all exercise without grimace or compensation; She is able to pick up items from floor without difficulty and carries heavy bag daily. She would benefit from additional skilled PT intervention to improve UE strength for less discomfort.    Rehab Potential Good   Clinical Impairments Affecting Rehab Potential positive:  motivation, caregiver support, negative: chronicity of pain; Patient's clinical presentation is evolving as her pain fluctuates from her neck to her back and comes and goes;    PT Frequency 1x / week   PT Duration 4 weeks   PT Treatment/Interventions ADLs/Self Care Home Management;Cryotherapy;Electrical Stimulation;Moist Heat;Traction;Balance training;Manual  techniques;Therapeutic exercise;Therapeutic activities;Functional mobility training;Stair training;Gait training;Ultrasound;Neuromuscular re-education;Patient/family education;Energy conservation;Dry needling;Passive range of motion   PT Next Visit Plan advance  HEP working on UE/neck and back flexibility;    PT Home Exercise Plan advanced- see patient instructions;    Consulted and Agree with Plan of Care Patient      Patient will benefit from skilled therapeutic intervention in order to improve the following deficits and impairments:  Decreased endurance, Obesity, Decreased activity tolerance, Decreased strength, Pain, Increased muscle spasms, Difficulty walking, Decreased balance, Decreased mobility, Improper body mechanics, Decreased range of motion, Postural dysfunction, Impaired flexibility, Decreased safety awareness  Visit Diagnosis: Cervicalgia  Bilateral low back pain without sciatica  Muscle weakness (generalized)  Stiffness of right shoulder, not elsewhere classified  Stiffness of left shoulder, not elsewhere classified     Problem List Patient Active Problem List   Diagnosis Date Noted  . Ovarian cyst   . H/O hyperlipidemia   . Pain, female pelvic   . Fibroids   . Pelvic pain 09/27/2011  . S/P endometrial ablation 09/27/2011  . Chronic PID (chronic pelvic inflammatory disease) 09/23/2011  . Pelvic adhesions 09/23/2011  . Spasm of colon 09/23/2011  . Breast cancer, left breast ([place]) 09/23/2011    Trotter,Margaret PT, DPT 09/09/2015, 4:49 PM  [place] MAIN [facility]  SERVICES [address] [place], [place], [address] Phone: [phone]   Fax:  [phone]  Name: [name] MRN: [id number] Date of Birth: 18-Nov-1955

## 2015-09-09 NOTE — Patient Instructions (Signed)
   Spinal Mobility (Cat / Camel): Flexion / Extension    Get ON TARGET. Knees on Bed: 1.Cat: Buttocks up, arch spine segmentally, bottom to top: lift chest as head moves back, look up. 2.Camel: Reverse movement. Close eyes, lower head, tuck chin, compress chest and abdomen, round back. Hold _5__ seconds. Repeat 5-10___ times. Do _2__ sessions per day.  Copyright  VHI. All rights reserved.  Resisted Horizontal Abduction: Bilateral    Sit or stand, tubing in both hands, arms out in front. Keeping arms straight, pinch shoulder blades together and stretch arms out. Repeat _10___ times per set. Do __2__ sets per session. Do __2__ sessions per day.  http://orth.exer.us/969   Copyright  VHI. All rights reserved.  Wall Push-Up    With feet and hands shoulder-width apart, lean into wall, then push away from wall. Repeat ___10_ times. Do __2__ sessions per day.  http://cc.exer.us/57   Copyright  VHI. All rights reserved.

## 2015-09-10 ENCOUNTER — Telehealth: Payer: Self-pay | Admitting: Oncology

## 2015-09-10 ENCOUNTER — Ambulatory Visit (HOSPITAL_COMMUNITY): Admission: RE | Admit: 2015-09-10 | Payer: BC Managed Care – PPO | Source: Ambulatory Visit

## 2015-09-10 NOTE — Telephone Encounter (Signed)
[name] called and said a message was left on her voicemail saying to call [phone] regarding her MRI today at 1:00.  She said the number was not working and she was not sure who to call.  Transferred her to MRI.

## 2015-09-11 ENCOUNTER — Ambulatory Visit: Payer: BC Managed Care – PPO | Admitting: Physical Therapy

## 2015-09-16 ENCOUNTER — Ambulatory Visit: Payer: BC Managed Care – PPO | Attending: Family Medicine | Admitting: Physical Therapy

## 2015-09-16 DIAGNOSIS — M545 Low back pain: Secondary | ICD-10-CM | POA: Insufficient documentation

## 2015-09-16 DIAGNOSIS — M25612 Stiffness of left shoulder, not elsewhere classified: Secondary | ICD-10-CM | POA: Insufficient documentation

## 2015-09-16 DIAGNOSIS — M25611 Stiffness of right shoulder, not elsewhere classified: Secondary | ICD-10-CM | POA: Insufficient documentation

## 2015-09-16 DIAGNOSIS — M542 Cervicalgia: Secondary | ICD-10-CM | POA: Insufficient documentation

## 2015-09-16 DIAGNOSIS — M6281 Muscle weakness (generalized): Secondary | ICD-10-CM | POA: Insufficient documentation

## 2015-09-17 ENCOUNTER — Ambulatory Visit (HOSPITAL_COMMUNITY)
Admission: RE | Admit: 2015-09-17 | Discharge: 2015-09-17 | Disposition: A | Discharge status: Home / Self Care | Payer: BC Managed Care – PPO | Source: Ambulatory Visit | Attending: Nurse Practitioner | Admitting: Nurse Practitioner

## 2015-09-17 DIAGNOSIS — N644 Mastodynia: Secondary | ICD-10-CM | POA: Diagnosis present

## 2015-09-17 DIAGNOSIS — C50912 Malignant neoplasm of unspecified site of left female breast: Secondary | ICD-10-CM | POA: Insufficient documentation

## 2015-09-17 MED ORDER — GADOBENATE DIMEGLUMINE 529 MG/ML IV SOLN
20.0000 mL | Freq: Once | INTRAVENOUS | Status: AC | PRN
  Administered 2015-09-17: 20 mL via INTRAVENOUS

## 2015-09-18 ENCOUNTER — Encounter: Payer: No Typology Code available for payment source | Admitting: Physical Therapy

## 2015-09-23 ENCOUNTER — Encounter: Payer: Self-pay | Admitting: Physical Therapy

## 2015-09-23 ENCOUNTER — Ambulatory Visit: Payer: BC Managed Care – PPO | Admitting: Physical Therapy

## 2015-09-23 DIAGNOSIS — M542 Cervicalgia: Secondary | ICD-10-CM | POA: Diagnosis not present

## 2015-09-23 DIAGNOSIS — M545 Low back pain, unspecified: Secondary | ICD-10-CM

## 2015-09-23 DIAGNOSIS — M6281 Muscle weakness (generalized): Secondary | ICD-10-CM

## 2015-09-23 DIAGNOSIS — M25611 Stiffness of right shoulder, not elsewhere classified: Secondary | ICD-10-CM | POA: Diagnosis present

## 2015-09-23 DIAGNOSIS — M25612 Stiffness of left shoulder, not elsewhere classified: Secondary | ICD-10-CM | POA: Diagnosis present

## 2015-09-23 NOTE — Therapy (Signed)
[place] MAIN [facility] SERVICES [address] [place], [place], [address] Phone: [phone]   Fax:  [phone]  Physical Therapy Treatment/Discharge Summary  Patient Details  Name: [name] MRN: [id number] Date of Birth: 1955/12/12 Referring Provider: Delman Cheadle MD  Encounter Date: 09/23/2015      PT End of Session - 09/23/15 1618    Visit Number 6   Number of Visits 13   Date for PT Re-Evaluation 09/23/15   PT Start Time 3710   PT Stop Time 6269   PT Time Calculation (min) 30 min   Activity Tolerance Patient tolerated treatment well;No increased pain   Behavior During Therapy [facility] for tasks assessed/performed      Past Medical History  Diagnosis Date  . Abnormal Pap smear 05/25/1991  . Headache(784.0)   . Varicose veins     Mother  . Anemia   . Bladder infection   . H/O menorrhagia   . Oligomenorrhea     h/o  . Ovarian cyst     h/o asymptomatic  . H/O hyperlipidemia   . Pain, female pelvic     h/o  . Fibroids     h/o  . Diabetes mellitus without complication ([place])   . Breast cancer, left ([place])     survivor; remission since 2008  . Depression 07/1991    postpartum depression; non currently;     Past Surgical History  Procedure Laterality Date  . Lysis of adhesions  Resolved 10/26/90    For pelvic adhesions  . Colposcopy  06/1991  . Tubal ligation  1997  . Mastectomy  2008    left breast  . Hysteroscopy w/d&c  07/20/2007  . Diagnostic laparoscopy  1994  . Breast surgery      There were no vitals filed for this visit.      Subjective Assessment - 09/23/15 1617    Subjective "I missed last week because we had an exam in school. I am finished with testing but now I have to do a lot of grading." Patient reports doing a lot of teadiess work right now; She was late to appointment because of valet services. She reports feeling some better with less neck discomfort;    Pertinent History recently diagnosed with  diabetes, chronicity of pain, obesity   How long can you sit comfortably? 20-30 min;    How long can you stand comfortably? 20-30 min;    How long can you walk comfortably? walks at slower pace; able to walk >500 feet;    Diagnostic tests not sure;    Patient Stated Goals be able to lift arms without pain;    Currently in Pain? No/denies   Pain Onset More than a month ago   Aggravating Factors  reports having intermittent pain (short duration) and then it will go away; She reports still having neck and upper back pain especially with straining or with excessive movement;   Effect of Pain on Daily Activities does have sleep disturbances; Patient reports getting up about 2 sleep disturbances per night; She reports having to get up because of bathroom needs or because of discomfort in arm from sleeping on side;             [facility] PT Assessment - 09/23/15 0001    Observation/Other Assessments   Modified [place] 22% impaired (moderate disability) improved from 07/29/15 which was 56%   AROM   Overall AROM Comments Patient able to reach behind head with ease (functional  ROM)   Right Shoulder Flexion 150 Degrees   Right Shoulder ABduction 135 Degrees   Left Shoulder Flexion 150 Degrees   Left Shoulder ABduction 135 Degrees   Strength   Overall Strength Comments hip Flexion 4/5; hip abduction/adduction, knee flexion/extension and ankle 4+/5   Standardized Balance Assessment   Five times sit to stand comments  27 sec without HHA (>15 sec indicates increased risk for falls; improved from 07/29/15 which was 38 sec)   10 Meter Walk 0.95 m/s; home ambulator; improved from 07/29/15 which was 0.8 m/s        TREATMENT: Warm up on UBE BUE only level 2, backwards x3 min (Unbilled);  PT instructed patient in modified oswestry, 5 times sit<>Stand, assessed ROM etc to address goals. Please see above; She continues to report high levels of pain however is able to demonstrate functional ROM and perform  most exercise without grimace or compensation;  She is independent in ascend/descend 12 steps with 1 rail, forward reciprocally with good safety awareness and good mobility;  PT re-educated patient in HEP with instruction to avoid forward shoulder rolls but focus on backwards shoulder rolls. Also reinforced importance of doing cat/camel stretch to reduce back stiffness.; Patient verbalized independence in lifting techniques with knowing to avoid twisting when bending forward which was instructed at previous sessions;                       PT Education - 09/23/15 1738    Education provided Yes   Education Details findings, recommendations, HEP reinforced;   Person(s) Educated Patient   Methods Explanation;Verbal cues   Comprehension Verbalized understanding;Returned demonstration;Verbal cues required             PT Long Term Goals - 09/23/15 1618    PT LONG TERM GOAL #1   Title Patient will be independent in home exercise program to improve strength/mobility for better functional independence with ADLs. by 09/23/15   Time 6   Period Weeks   Status Achieved   PT LONG TERM GOAL #2   Title Patient (60 years old) will complete five times sit to stand test in < 15 seconds indicating an increased LE strength and improved balance. by 09/23/15   Time 6   Period Weeks   Status Partially Met   PT LONG TERM GOAL #3   Title  Patient will be independent with ascend/descend 12 steps using single UE in step over step pattern without LOB with minimal difficulty to improve ability to get to bedroom in home by 09/23/15   Time 6   Period Weeks   Status Achieved   PT LONG TERM GOAL #4   Title Patient will increase BLE gross strength to 4+/5 as to improve functional strength for independent gait, increased standing tolerance and increased ADL ability. by 09/23/15   Time 6   Period Weeks   Status Partially Met   PT LONG TERM GOAL #5   Title Patient will report a worst pain of  3/10 on VAS in  neck and back    to improve tolerance with ADLs and reduced symptoms with activities. by 09/23/15   Baseline 7-8/10 in neck/shoulder;    Time 6   Period Weeks   Status Not Met   PT LONG TERM GOAL #6   Title Patient will increase BUE shoulder AROM to >130 degrees flexion/abduction without increase in pain for increased each of overhead lifting and return to PLOF by 09/23/15  Time 6   Period Weeks   Status Achieved               Plan - 09/23/15 1626    Clinical Impression Statement Patient was late to PT session; She reports continued neck pain which she feels is worse with exercise. However in previous sessions she will report less pain with exercise. She is able to demonstrate functional ROM in BUE however when perform goniometric measurement, patient will try to limit overhead reach. She reports multiple sleep disturbances related to going to the bathroom but then reports that she will have pain and have trouble sleeping; She does report epsides of severe pain 8/10, however did score better on modified oswestry score. She has met most goals. She is appropriate for discharge from therapy at this time. PT reinforced importance of doing HEP continuously to reduce pain with desk work and repetitive motion.    Rehab Potential Good   Clinical Impairments Affecting Rehab Potential positive: motivation, caregiver support, negative: chronicity of pain; Patient's clinical presentation is evolving as her pain fluctuates from her neck to her back and comes and goes;    PT Frequency 1x / week   PT Duration 4 weeks   PT Treatment/Interventions ADLs/Self Care Home Management;Cryotherapy;Electrical Stimulation;Moist Heat;Traction;Balance training;Manual techniques;Therapeutic exercise;Therapeutic activities;Functional mobility training;Stair training;Gait training;Ultrasound;Neuromuscular re-education;Patient/family education;Energy conservation;Dry needling;Passive range of motion   PT  Next Visit Plan advance  HEP working on UE/neck and back flexibility;    PT Home Exercise Plan continue as given;    Consulted and Agree with Plan of Care Patient      Patient will benefit from skilled therapeutic intervention in order to improve the following deficits and impairments:  Decreased endurance, Obesity, Decreased activity tolerance, Decreased strength, Pain, Increased muscle spasms, Difficulty walking, Decreased balance, Decreased mobility, Improper body mechanics, Decreased range of motion, Postural dysfunction, Impaired flexibility, Decreased safety awareness  Visit Diagnosis: Cervicalgia  Bilateral low back pain without sciatica  Muscle weakness (generalized)  Stiffness of right shoulder, not elsewhere classified  Stiffness of left shoulder, not elsewhere classified     Problem List Patient Active Problem List   Diagnosis Date Noted  . Ovarian cyst   . H/O hyperlipidemia   . Pain, female pelvic   . Fibroids   . Pelvic pain 09/27/2011  . S/P endometrial ablation 09/27/2011  . Chronic PID (chronic pelvic inflammatory disease) 09/23/2011  . Pelvic adhesions 09/23/2011  . Spasm of colon 09/23/2011  . Breast cancer, left breast ([place]) 09/23/2011    Karine Garn PT, DPT 09/23/2015, 5:41 PM  [place] MAIN [facility] SERVICES [address] [place], [place], [address] Phone: [phone]   Fax:  [phone]  Name: ULAH OLMO MRN: [id number] Date of Birth: May 20, 1955

## 2015-09-25 ENCOUNTER — Encounter: Payer: No Typology Code available for payment source | Admitting: Physical Therapy

## 2015-10-02 ENCOUNTER — Telehealth: Payer: Self-pay | Admitting: *Deleted

## 2015-10-02 NOTE — Telephone Encounter (Signed)
Pt needs a letter from her  to her attorney stating that she is released from PT, because she was released from them.  She also wants a copy of her bills sent them of what she paid in copays.  She wants to speak with Dr. Brigitte Pulse about this but I insisted on taking a message.   She want this done as soon as possible so she can get paid.  She can be called tomorrow after 4pm.  Attorneys name, address and phone number  [facility] [address] [place],  [address]  [phone]
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
## 2015-10-03 NOTE — Telephone Encounter (Signed)
Tried to call pt, number disconnected.

## 2015-10-23 ENCOUNTER — Other Ambulatory Visit: Payer: Self-pay | Admitting: Oncology

## 2015-10-24 ENCOUNTER — Other Ambulatory Visit: Payer: Self-pay | Admitting: *Deleted

## 2015-10-24 MED ORDER — TAMOXIFEN CITRATE 20 MG PO TABS: ORAL_TABLET | ORAL | Status: DC

## 2015-11-11 ENCOUNTER — Telehealth: Payer: Self-pay

## 2015-11-11 NOTE — Telephone Encounter (Signed)
Paperwork was printed out for the law office, patient will come by on 11/12/15 to pick it up. I will place in the draw at 102.

## 2015-12-13 ENCOUNTER — Ambulatory Visit (INDEPENDENT_AMBULATORY_CARE_PROVIDER_SITE_OTHER): Payer: BC Managed Care – PPO | Admitting: Family Medicine

## 2015-12-13 ENCOUNTER — Ambulatory Visit (INDEPENDENT_AMBULATORY_CARE_PROVIDER_SITE_OTHER): Payer: BC Managed Care – PPO

## 2015-12-13 VITALS — BP 152/98 | HR 98 | Temp 98.1°F | Resp 18 | Ht 65.5 in | Wt 231.6 lb

## 2015-12-13 DIAGNOSIS — I10 Essential (primary) hypertension: Secondary | ICD-10-CM | POA: Diagnosis not present

## 2015-12-13 DIAGNOSIS — R0602 Shortness of breath: Secondary | ICD-10-CM

## 2015-12-13 DIAGNOSIS — R05 Cough: Secondary | ICD-10-CM

## 2015-12-13 DIAGNOSIS — R059 Cough, unspecified: Secondary | ICD-10-CM

## 2015-12-13 MED ORDER — DOXYCYCLINE HYCLATE 100 MG PO CAPS: 100.0000 mg | ORAL_CAPSULE | Freq: Two times a day (BID) | ORAL | 0 refills | Status: DC

## 2015-12-13 MED ORDER — AMLODIPINE BESYLATE 5 MG PO TABS: 2.5000 mg | ORAL_TABLET | Freq: Every day | ORAL | 1 refills | Status: DC

## 2015-12-13 MED ORDER — ALBUTEROL SULFATE HFA 108 (90 BASE) MCG/ACT IN AERS: 2.0000 | INHALATION_SPRAY | RESPIRATORY_TRACT | 1 refills | Status: DC | PRN

## 2015-12-13 NOTE — Progress Notes (Signed)
Patient ID: [name], female    DOB: 10/07/1955, 60 y.o.   MRN: [id number]  PCP: Kandice Hams, MD  Chief Complaint  Patient presents with  . Follow-up    for infection in neck  . Cough    x3 days greenish phlegm    Subjective:   HPI Presents for evaluation of a productive cough times three days. Patient recently visited family and was indirect contact with grandson who was ill. She describes a 4 day history of productive cough with greenish sputum, headache and sore throat. She has experienced some shortness of breath and thinks she may have had wheezing. Reports some clear sputum production She has been taking combination of dayquil and nyquil without relief of symptoms. Reports a clear nasal drainage from nose. Denies headache or facial tenderness.  . Social History   Social History  . Marital status: Married    Spouse name: N/A  . Number of children: N/A  . Years of education: N/A   Occupational History  . Not on file.   Social History Main Topics  . Smoking status: Former Smoker    Quit date: 06/08/1981  . Smokeless tobacco: Not on file  . Alcohol use 0.5 - 1.0 oz/week    1 - 2 Standard drinks or equivalent per week  . Drug use: No  . Sexual activity: Yes    Birth control/ protection: Post-menopausal   Other Topics Concern  . Not on file   Social History Narrative  . No narrative on file   . Family History  Problem Relation Age of Onset  . Cancer Other   . Hypertension Other   . Diabetes Other   . Diabetes Sister   . Hypertension Sister        Review of Systems  Constitutional: Positive for fatigue.  HENT: Positive for congestion, sneezing and sore throat. Negative for sinus pressure.   Eyes: Negative.   Respiratory: Positive for cough, shortness of breath and wheezing.   Cardiovascular: Negative.   Gastrointestinal: Negative.          Patient Active Problem List   Diagnosis Date Noted  . Ovarian cyst   . H/O  hyperlipidemia   . Pain, female pelvic   . Fibroids   . Pelvic pain 09/27/2011  . S/P endometrial ablation 09/27/2011  . Chronic PID (chronic pelvic inflammatory disease) 09/23/2011  . Pelvic adhesions 09/23/2011  . Spasm of colon 09/23/2011  . Breast cancer, left breast ([place]) 09/23/2011     Prior to Admission medications   Medication Sig Start Date End Date Taking? Authorizing Provider  atorvastatin (LIPITOR) 10 MG tablet Take 10 mg by mouth daily.   Yes Historical Provider, MD  fish oil-omega-3 fatty acids 1000 MG capsule Take 2 g by mouth daily. Reported on 08/13/2015   Yes Historical Provider, MD  metFORMIN (GLUCOPHAGE) 500 MG tablet Take 500 mg by mouth 2 (two) times daily. 11/27/13  Yes Seward Carol, MD  Multiple Vitamin (MULTIVITAMIN WITH MINERALS) TABS Take 1 tablet by mouth daily. Reported on 08/13/2015   Yes Historical Provider, MD  tamoxifen (NOLVADEX) 20 MG tablet TAKE 1 TABLET (20 MG TOTAL) BY MOUTH DAILY. 10/24/15  Yes Chauncey Cruel, MD  VITAMIN D, CHOLECALCIFEROL, PO Take 2,000 Units by mouth. Reported on 08/13/2015   Yes Historical Provider, MD  aspirin (ASPIRIN EC) 81 MG EC tablet Take 81 mg by mouth daily. Reported on 08/13/2015    Historical Provider, MD  aspirin EC 325 MG tablet  Take 325 mg by mouth daily. Pt takes two daily    Historical Provider, MD  cetirizine (ZYRTEC) 10 MG tablet Take 1 tablet (10 mg total) by mouth at bedtime. Patient not taking: Reported on 12/13/2015 06/20/15   Shawnee Knapp, MD  cyclobenzaprine (FLEXERIL) 5 MG tablet TAKE 1- 2 TABLETS BY MOUTH AT BEDTIME AS NEEDED FOR MUSCLE SPASMS Patient not taking: Reported on 12/13/2015 06/20/15   Shawnee Knapp, MD  diclofenac (VOLTAREN) 75 MG EC tablet Take 75 mg by mouth as needed. Reported on 08/13/2015    Historical Provider, MD  Guaifenesin [facility] MAXIMUM STRENGTH) 1200 MG TB12 Take 1 tablet (1,200 mg total) by mouth every 12 (twelve) hours as needed. Patient not taking: Reported on 12/13/2015 06/20/15   Shawnee Knapp,  MD  ipratropium (ATROVENT) 0.03 % nasal spray Place 2 sprays into the nose 4 (four) times daily. Patient not taking: Reported on 12/13/2015 06/20/15   Shawnee Knapp, MD  meloxicam (MOBIC) 15 MG tablet Take 15 mg by mouth daily as needed. 11/28/13   Seward Carol, MD  oxybutynin (DITROPAN-XL) 5 MG 24 hr tablet Take 5 mg by mouth daily. Reported on 08/13/2015 11/29/13   Seward Carol, MD  ranitidine (ZANTAC) 150 MG tablet Take 1 tablet (150 mg total) by mouth 2 (two) times daily. Patient not taking: Reported on 12/13/2015 06/20/15   Shawnee Knapp, MD  triamcinolone cream (KENALOG) 0.1 % Apply 1 application topically 2 (two) times daily. Patient not taking: Reported on 08/13/2015 06/20/15   Shawnee Knapp, MD  vitamin E (VITAMIN E) 400 UNIT capsule Take 400 Units by mouth daily.    Historical Provider, MD  zinc sulfate 220 MG capsule Take 220 mg by mouth daily.    Historical Provider, MD     Allergies  Allergen Reactions  . Other Shortness Of Breath    Contrast dye- Throat swelling  . Multihance [Gadobenate Dimeglumine] Hives, Itching and Nausea Only    Pt had breast mri with multihance, was at first nauseated, clammy and dizzy. Then after about 15 minutes had rash,itching and hives. Treated with 50mg  of benadryl at office and seen by dr. Jobe Igo  . Amoxicillin [Amoxicillin] Swelling  . Kiwi Extract Nausea And Vomiting and Rash       Objective:  Physical Exam  Constitutional: She is oriented to person, place, and time. She appears well-developed and well-nourished.  HENT:  Head: Normocephalic and atraumatic.  Right Ear: External ear normal.  Left Ear: External ear normal.  Nose: Nose normal.  Oropharynx mild erythema present  Neck: Normal range of motion.  Cardiovascular: Normal rate, regular rhythm and normal heart sounds.   Blood pressure significant elevated times 2 on today's visit 158/94 and 152/98  Pulmonary/Chest: Breath sounds normal.  Diminished breath sounds No audible wheeze Dyspnea  present  Musculoskeletal: Normal range of motion.  Neurological: She is alert and oriented to person, place, and time. She has normal reflexes.  Skin: Skin is warm and dry.  Psychiatric:  Anxious affect      Assessment & Plan:   1. Shortness of breath -DG Chest 2 View; Future 2. Cough  Patient presents today with a 4 day history of productive cough, nasal congestion, and shortness of breath. Chest x-ray was negative of any acute pulmonary or cardiac changes. Patient is likely suffering from an acute bronchitis.  Treat symptomatically  Guaifenesin 1200 mg every 12 hours  Albuterol  180 mcg, 2 puffs every 4 hours as needed for  shortness of breath Doxycyline 100 mg, twice daily for 10 days-fill prescription if symptoms worsen or do not improve in 48-72 hours  3. Essential hypertension Patient presents today with blood pressure readings measured 25 minutes apart both reflect systolic readings in [id number] and diastolic greater than 80. Patient has type 2 diabetes and takes Metformin 500 mg two times daily.  Carroll Sage. Kenton Kingfisher, MSN, FNP-C [facility]

## 2015-12-13 NOTE — Patient Instructions (Addendum)
  Over the counter medications that do not increase BP  Guaifenesin  (Mucinex)  1200 mg every 12 hours as needed for cough.  Coricidin D (decongestion) take as directed.  If symptoms do not improve within 48 hours, you may feel the antibiotic prescription.    IF you received an x-ray today, you will receive an invoice from [facility]. Please contact [facility] at [phone] with questions or concerns regarding your invoice.   IF you received labwork today, you will receive an invoice from [employer]. Please contact [facility] at [phone] with questions or concerns regarding your invoice.   Our billing staff will not be able to assist you with questions regarding bills from these companies.  You will be contacted with the lab results as soon as they are available. The fastest way to get your results is to activate your My Chart account. Instructions are located on the last page of this paperwork. If you have not heard from [place] regarding the results in 2 weeks, please contact this office.

## 2016-02-11 ENCOUNTER — Telehealth: Payer: Self-pay

## 2016-02-11 NOTE — Telephone Encounter (Signed)
Msg is for [name],  Pt made an appointment on 10/5 because she was not sure whether shaw needed to see her for the appointment note from the visit on 06/20/15. She stated she will need the note to say movement was restricted due to muscle spasms and she needed to be prescribed meds for the muscle spasms.   Please advise  [phone] Leave msg on telephone

## 2016-02-13 NOTE — Telephone Encounter (Signed)
Pt needs to be seen if she thinks she needs work restrictions and certainly for any meds - she completed PT and was released. Or is she wanted a post-dated note stated that at her visit on 2/3 she had movement restriction from a muscle spasm which is true - in that case I guess just rewrite the letter with the date of the 2/3 visit and make a notation at the bottom that "this note was addended on 9/29 in order to provide further information about previous disability"

## 2016-02-13 NOTE — Telephone Encounter (Signed)
Is this ok to change note?

## 2016-02-16 NOTE — Telephone Encounter (Signed)
Lm for patient to call back to discuss specifics or keep appointment with chelle 02/19/16 to discuss.

## 2016-02-19 ENCOUNTER — Encounter: Payer: Self-pay | Admitting: Family Medicine

## 2016-02-19 ENCOUNTER — Ambulatory Visit (INDEPENDENT_AMBULATORY_CARE_PROVIDER_SITE_OTHER): Payer: BC Managed Care – PPO | Admitting: Family Medicine

## 2016-02-19 VITALS — BP 118/60 | HR 96 | Temp 97.8°F | Resp 16 | Ht 66.0 in | Wt 227.0 lb

## 2016-02-19 DIAGNOSIS — H538 Other visual disturbances: Secondary | ICD-10-CM | POA: Diagnosis not present

## 2016-02-19 DIAGNOSIS — Z23 Encounter for immunization: Secondary | ICD-10-CM | POA: Diagnosis not present

## 2016-02-19 DIAGNOSIS — E1139 Type 2 diabetes mellitus with other diabetic ophthalmic complication: Secondary | ICD-10-CM

## 2016-02-19 NOTE — Patient Instructions (Signed)
     IF you received an x-ray today, you will receive an invoice from [facility]. Please contact [facility] at [phone] with questions or concerns regarding your invoice.   IF you received labwork today, you will receive an invoice from [facility]. Please contact [facility] at [phone] with questions or concerns regarding your invoice.   Our billing staff will not be able to assist you with questions regarding bills from these companies.  You will be contacted with the lab results as soon as they are available. The fastest way to get your results is to activate your My Chart account. Instructions are located on the last page of this paperwork. If you have not heard from us regarding the results in 2 weeks, please contact this office.      
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
## 2016-02-24 ENCOUNTER — Other Ambulatory Visit: Payer: Self-pay | Admitting: Family Medicine

## 2016-02-24 MED ORDER — METRONIDAZOLE 1 % EX GEL: Freq: Every day | CUTANEOUS | 0 refills | Status: DC

## 2016-02-24 NOTE — Telephone Encounter (Signed)
02/2016 last ov and 07/2015 last labs.

## 2016-03-05 ENCOUNTER — Telehealth: Payer: Self-pay

## 2016-03-05 NOTE — Telephone Encounter (Signed)
Dr. Brigitte Pulse this patient states she is waiting for a letter that you were going to write for her, she would not give me the details, she also needs her BP meds called into [facility]. Call home number and leave message [phone] when letter is ready and when BP meds are called in and ready for pickup.

## 2016-03-09 ENCOUNTER — Encounter: Payer: Self-pay | Admitting: Family Medicine

## 2016-03-09 NOTE — Telephone Encounter (Signed)
Letter written and signed - in nurse box for pt pick up.  At time of pt's last visit 10/10, I e-rx'ed a 6 mo supply of her amlodipine to her pharmacy [facility] [place] - they should still have it on file.  Ok to resend prn. She should follow-up with her PCP for additional refills.

## 2016-03-12 NOTE — Telephone Encounter (Signed)
Patient came and got the letter and then called back stating that it was not correct and her lawyer will not take it because it does not have the dates on it from the beginning of her treatment with Dr Brigitte Pulse. She states that it needs to have the dates from November when Dr Brigitte Pulse first started treating her.  Patient would like someone to call her to get all the information correct for her lawyer  Her number is [phone]

## 2016-03-16 ENCOUNTER — Telehealth: Payer: Self-pay

## 2016-03-16 NOTE — Telephone Encounter (Signed)
Dr Brigitte Pulse    Patient dropped off letter to be amended.   Put in your box.

## 2016-03-17 NOTE — Telephone Encounter (Signed)
Pt dropped of letter to be addended. See ph message from yesterday.

## 2016-03-19 ENCOUNTER — Telehealth: Payer: Self-pay | Admitting: *Deleted

## 2016-03-19 NOTE — Telephone Encounter (Signed)
Lmom that letter is ready for pickup

## 2016-03-23 ENCOUNTER — Encounter: Payer: Self-pay | Admitting: Family Medicine

## 2016-03-23 NOTE — Telephone Encounter (Signed)
Re-written

## 2016-04-13 ENCOUNTER — Telehealth: Payer: Self-pay
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
## 2016-04-16 NOTE — Telephone Encounter (Signed)
The letter written on 11/7 has the info that pt was sent to PT in the letter. Does pt need something else, or does she not have this letter?

## 2016-04-16 NOTE — Telephone Encounter (Signed)
Attempted to reach patient Number listed unable to accept vm

## 2016-08-12 ENCOUNTER — Ambulatory Visit: Payer: No Typology Code available for payment source | Admitting: Oncology

## 2016-08-12 ENCOUNTER — Other Ambulatory Visit: Payer: No Typology Code available for payment source

## 2016-08-12 DIAGNOSIS — Z170 Estrogen receptor positive status [ER+]: Principal | ICD-10-CM

## 2016-08-12 DIAGNOSIS — C50812 Malignant neoplasm of overlapping sites of left female breast: Secondary | ICD-10-CM | POA: Insufficient documentation

## 2016-08-12 NOTE — Progress Notes (Signed)
No show

## 2017-03-07 ENCOUNTER — Emergency Department (HOSPITAL_COMMUNITY)
Admission: EM | Admit: 2017-03-07 | Discharge: 2017-03-07 | Disposition: A | Discharge status: Home / Self Care | Payer: BC Managed Care – PPO | Attending: Emergency Medicine | Admitting: Emergency Medicine

## 2017-03-07 ENCOUNTER — Emergency Department (HOSPITAL_COMMUNITY): Payer: BC Managed Care – PPO

## 2017-03-07 ENCOUNTER — Encounter (HOSPITAL_COMMUNITY): Payer: Self-pay

## 2017-03-07 DIAGNOSIS — Z853 Personal history of malignant neoplasm of breast: Secondary | ICD-10-CM | POA: Diagnosis not present

## 2017-03-07 DIAGNOSIS — Z87891 Personal history of nicotine dependence: Secondary | ICD-10-CM | POA: Diagnosis not present

## 2017-03-07 DIAGNOSIS — Z7982 Long term (current) use of aspirin: Secondary | ICD-10-CM | POA: Diagnosis not present

## 2017-03-07 DIAGNOSIS — Z7984 Long term (current) use of oral hypoglycemic drugs: Secondary | ICD-10-CM | POA: Insufficient documentation

## 2017-03-07 DIAGNOSIS — R55 Syncope and collapse: Secondary | ICD-10-CM

## 2017-03-07 DIAGNOSIS — E119 Type 2 diabetes mellitus without complications: Secondary | ICD-10-CM | POA: Insufficient documentation

## 2017-03-07 DIAGNOSIS — R110 Nausea: Secondary | ICD-10-CM | POA: Diagnosis not present

## 2017-03-07 LAB — CBC WITH DIFFERENTIAL/PLATELET
BASOS ABS: 0 10*3/uL (ref 0.0–0.1)
Basophils Relative: 0 %
Eosinophils Absolute: 0 10*3/uL (ref 0.0–0.7)
Eosinophils Relative: 0 %
HEMATOCRIT: 38.7 % (ref 36.0–46.0)
HEMOGLOBIN: 12.2 g/dL (ref 12.0–15.0)
LYMPHS PCT: 20 %
Lymphs Abs: 2.2 10*3/uL (ref 0.7–4.0)
MCH: 27.1 pg (ref 26.0–34.0)
MCHC: 31.5 g/dL (ref 30.0–36.0)
MCV: 86 fL (ref 78.0–100.0)
MONO ABS: 0.6 10*3/uL (ref 0.1–1.0)
MONOS PCT: 6 %
NEUTROS ABS: 8 10*3/uL — AB (ref 1.7–7.7)
NEUTROS PCT: 74 %
Platelets: 221 10*3/uL (ref 150–400)
RBC: 4.5 MIL/uL (ref 3.87–5.11)
RDW: 14.1 % (ref 11.5–15.5)
WBC: 10.9 10*3/uL — ABNORMAL HIGH (ref 4.0–10.5)

## 2017-03-07 LAB — COMPREHENSIVE METABOLIC PANEL
ALK PHOS: 82 U/L (ref 38–126)
ALT: 16 U/L (ref 14–54)
AST: 20 U/L (ref 15–41)
Albumin: 3.9 g/dL (ref 3.5–5.0)
Anion gap: 10 (ref 5–15)
BILIRUBIN TOTAL: 0.7 mg/dL (ref 0.3–1.2)
BUN: 9 mg/dL (ref 6–20)
CALCIUM: 9.5 mg/dL (ref 8.9–10.3)
CO2: 26 mmol/L (ref 22–32)
CREATININE: 0.91 mg/dL (ref 0.44–1.00)
Chloride: 101 mmol/L (ref 101–111)
GFR calc Af Amer: >60 mL/min (ref >60)
Glucose, Bld: 154 mg/dL — ABNORMAL HIGH (ref 65–99)
POTASSIUM: 4.3 mmol/L (ref 3.5–5.1)
Sodium: 137 mmol/L (ref 135–145)
TOTAL PROTEIN: 7.8 g/dL (ref 6.5–8.1)

## 2017-03-07 LAB — TROPONIN I

## 2017-03-07 LAB — D-DIMER, QUANTITATIVE: D-Dimer, Quant: 0.67 ug/mL-FEU — ABNORMAL HIGH (ref 0.00–0.50)

## 2017-03-07 NOTE — ED Triage Notes (Signed)
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 Pt states she felt "foggy and that maybe it was my sugar because the doctor told me to be careful with sugar meds because I need to eat with it and I didn't have anything more than toast before meds today. I saw the room start to get dark and I was there and then I wasn't there". EMS stated glucose was 220 pta. Students assisted pt to floor, pt did not his anything on the way down. EMS began IV in left [facility] and upon arrival to ED, pt stated she forgot to tell them she cannot have anything done to the left side due to left mastectomy in 2008. Pt placed in position of comfort with bed locked and lowered, call bell in reach.

## 2017-03-07 NOTE — ED Provider Notes (Signed)
[facility] EMERGENCY DEPARTMENT Provider Note  CSN: [id number] Arrival date & time: 03/07/17  1415  History   Chief Complaint Chief Complaint  Patient presents with  . Loss of Consciousness   HPI [name] is a 61 y.o. female.  The history is provided by the patient.  Loss of Consciousness   This is a new problem. The current episode started 1 to 2 hours ago. The problem occurs rarely. The problem has been resolved. She lost consciousness for a period of 1 to 5 minutes. The problem is associated with normal activity and standing up. Associated symptoms include light-headedness and nausea. Pertinent negatives include abdominal pain, back pain, chest pain, clumsiness, confusion, congestion, diaphoresis, dizziness, fever, focal weakness, headaches, palpitations, seizures, slurred speech, vertigo, vomiting and weakness. She has tried nothing for the symptoms. Her past medical history is significant for DM and HTN.   Past Medical History:  Diagnosis Date  . Abnormal Pap smear 05/25/1991  . Anemia   . Bladder infection   . Breast cancer, left ([place])    survivor; remission since 2008  . Depression 07/1991   postpartum depression; non currently;   . Diabetes mellitus without complication ([place])   . Fibroids    h/o  . H/O hyperlipidemia   . H/O menorrhagia   . Headache(784.0)   . Oligomenorrhea    h/o  . Ovarian cyst    h/o asymptomatic  . Pain, female pelvic    h/o  . Varicose veins    Mother   Patient Active Problem List   Diagnosis Date Noted  . Malignant neoplasm of overlapping sites of left breast in female, estrogen receptor positive ([place]) 08/12/2016  . Ovarian cyst   . H/O hyperlipidemia   . Pain, female pelvic   . Fibroids   . Pelvic pain 09/27/2011  . S/P endometrial ablation 09/27/2011  . Chronic PID (chronic pelvic inflammatory disease) 09/23/2011  . Pelvic adhesions 09/23/2011  . Spasm of colon 09/23/2011   Past Surgical History:    Procedure Laterality Date  . BREAST SURGERY    . COLPOSCOPY  06/1991  . DIAGNOSTIC LAPAROSCOPY  1994  . HYSTEROSCOPY W/D&C  07/20/2007  . lysis of adhesions  Resolved 10/26/90   For pelvic adhesions  . MASTECTOMY  2008   left breast  . TUBAL LIGATION  1997   OB History    Gravida Para Term Preterm AB Living   2 2 2  0 0 2   SAB TAB Ectopic Multiple Live Births   0 0 0 0       Home Medications    Prior to Admission medications   Medication Sig Start Date End Date Taking? Authorizing Provider  albuterol (PROVENTIL HFA;VENTOLIN HFA) 108 (90 Base) MCG/ACT inhaler Inhale 2 puffs into the lungs every 4 (four) hours as needed for wheezing or shortness of breath (cough, shortness of breath or wheezing.). 12/13/15   Scot Jun, FNP  amLODipine (NORVASC) 5 MG tablet TAKE 0.5 TABLETS (2.5 MG TOTAL) BY MOUTH DAILY. 02/24/16   Shawnee Knapp, MD  aspirin (ASPIRIN EC) 81 MG EC tablet Take 81 mg by mouth daily. Reported on 08/13/2015    [provider]  aspirin EC 325 MG tablet Take 325 mg by mouth daily. Pt takes two daily    [provider]  atorvastatin (LIPITOR) 10 MG tablet Take 10 mg by mouth daily.    [provider]  diclofenac (VOLTAREN) 75 MG EC tablet Take 75 mg  by mouth as needed. Reported on 08/13/2015    [provider]  fish oil-omega-3 fatty acids 1000 MG capsule Take 2 g by mouth daily. Reported on 08/13/2015    [provider]  meloxicam (MOBIC) 15 MG tablet Take 15 mg by mouth daily as needed. 11/28/13   Seward Carol, MD  metFORMIN (GLUCOPHAGE) 500 MG tablet Take 500 mg by mouth 2 (two) times daily. 11/27/13   Seward Carol, MD  metroNIDAZOLE (METROGEL) 1 % gel Apply topically daily. 02/24/16   Shawnee Knapp, MD  Multiple Vitamin (MULTIVITAMIN WITH MINERALS) TABS Take 1 tablet by mouth daily. Reported on 08/13/2015    [provider]  oxybutynin (DITROPAN-XL) 5 MG 24 hr tablet Take 5 mg by mouth daily. Reported on 08/13/2015  11/29/13   Seward Carol, MD  tamoxifen (NOLVADEX) 20 MG tablet TAKE 1 TABLET (20 MG TOTAL) BY MOUTH DAILY. 10/24/15   Magrinat, Virgie Dad, MD  VITAMIN D, CHOLECALCIFEROL, PO Take 2,000 Units by mouth. Reported on 08/13/2015    [provider]  vitamin E (VITAMIN E) 400 UNIT capsule Take 400 Units by mouth daily.    [provider]  zinc sulfate 220 MG capsule Take 220 mg by mouth daily.    [provider]   Family History Family History  Problem Relation Age of Onset  . Cancer Other   . Hypertension Other   . Diabetes Other   . Diabetes Sister   . Hypertension Sister    Social History Social History  Substance Use Topics  . Smoking status: Former Smoker    Quit date: 06/08/1981  . Smokeless tobacco: Not on file  . Alcohol use 0.5 - 1.0 oz/week    1 - 2 Standard drinks or equivalent per week   Allergies   Other; Multihance [gadobenate dimeglumine]; Amoxicillin [amoxicillin]; and Kiwi extract  Review of Systems Review of Systems  Constitutional: Negative for chills, diaphoresis and fever.  HENT: Negative.  Negative for congestion, ear pain and sore throat.   Eyes: Negative for pain and visual disturbance.  Respiratory: Negative.  Negative for cough and shortness of breath.   Cardiovascular: Positive for syncope. Negative for chest pain, palpitations and leg swelling.  Gastrointestinal: Positive for nausea. Negative for abdominal pain and vomiting.  Genitourinary: Negative for dysuria and hematuria.  Musculoskeletal: Negative for arthralgias and back pain.  Skin: Negative for color change, pallor, rash and wound.  Neurological: Positive for light-headedness. Negative for dizziness, vertigo, focal weakness, seizures, syncope, speech difficulty, weakness and headaches.  Psychiatric/Behavioral: Negative for confusion.  All other systems reviewed and are negative.  Physical Exam Updated Vital Signs BP 138/60   Pulse 81   Temp 98.6 F (37 C) (Oral)    Resp 17   Ht 5\' 5"  (1.651 m)   Wt 102.1 kg (225 lb)   SpO2 98%   BMI 37.44 kg/m   Physical Exam  Constitutional: She is oriented to person, place, and time. She appears well-developed and well-nourished. No distress.  HENT:  Head: Normocephalic and atraumatic.  Nose: Nose normal.  Eyes: Pupils are equal, round, and reactive to light. Conjunctivae and EOM are normal.  Neck: Normal range of motion. Neck supple.  Cardiovascular: Normal rate and regular rhythm.   No murmur heard. Pulmonary/Chest: Effort normal and breath sounds normal. No respiratory distress.  Abdominal: Soft. She exhibits no distension. There is no tenderness.  Musculoskeletal: Normal range of motion. She exhibits no edema or tenderness.  Neurological: She is alert and  oriented to person, place, and time. No cranial nerve deficit or sensory deficit. She exhibits normal muscle tone. Coordination normal.  Skin: Skin is warm and dry. Capillary refill takes less than 2 seconds. No rash noted. She is not diaphoretic. No erythema.  Psychiatric: She has a normal mood and affect.  Nursing note and vitals reviewed.  ED Treatments / Results  Labs (all labs ordered are listed, but only abnormal results are displayed) Labs Reviewed - No data to display  EKG  EKG Interpretation  Date/Time:  Monday March 07 2017 14:24:04 EDT Ventricular Rate:  85 PR Interval:    QRS Duration: 87 QT Interval:  366 QTC Calculation: 436 R Axis:   56 Text Interpretation:  Sinus rhythm RSR' in V1 or V2, right VCD or RVH No acute changes Confirmed by Brantley Stage [phone]) on 03/07/2017 3:16:35 PM      Radiology No results found.  Procedures Procedures (including critical care time)  Medications Ordered in ED Medications - No data to display  Initial Impression / Assessment and Plan / ED Course  I have reviewed the triage vital signs and the nursing notes.  Pertinent labs & imaging results that were available during my care of the  patient were reviewed by me and considered in my medical decision making (see chart for details).  JEWELIANNA PANCOAST is a 61 y.o. female with PMH of DM and breast CA who presents after syncopal event.   Likely vasovagal syncope.   Patient has no concerning significant signs or symptoms of syncope including exertional chest pain or exertional syncope. No family history of sudden cardiac death or unexplained death.   Patient had no chest pain, no concerning EKG findings. Specifically no signs of Brugada, HOCM, pre-excitation, or prolonged QT.  Doubt PE, EKG unremarkable, patient is [place] and unable to be PERC negative. Ddimer negative once age adjusted. Do not suspect PE.  Patient denies tearing chest pain or back pain, therefore I doubt Aortic dissection.   No postictal period, loss of incontinence, tongue biting  or witnessed tonic clonic movements. Doubt Seizure.   Patient denies headache prior to or currently. No focal deficits found on neuro exam. Doubt SAH or ICH.   Patient denies any rectal bleeding, melena, no pallor on exam. No diplopia , dysarthria, ataxia, or vertigo prior to or after the event, doubt posterior circulation stroke.  Patient denies any alcohol or illicit drug use.   No history of CHF, Hematocrit  > 30%, EKG  normal appearing, no shortness of breath, no hypotension (no SBP<90). According to [facility] Syncope Rule, patient in low risk category for adverse events following syncope. No emergent causes identiified on thorough history and exam.  At this time, we believe patient is safe for discharge home and to follow up with their primary care doctor. Patient in agreement with plan and discharged home.   Final Clinical Impressions(s) / ED Diagnoses   Final diagnoses:  Syncope, unspecified syncope type   New Prescriptions Discharge Medication List as of 03/07/2017  6:06 PM       Fenton Foy, MD 03/08/17 0040    Forde Dandy, MD 03/08/17 [phone]

## 2017-03-07 NOTE — ED Provider Notes (Signed)
Medical screening examination/treatment/procedure(s) were conducted as a shared visit with non-physician practitioner(s) and myself.  I personally evaluated the patient during the encounter.   EKG Interpretation  Date/Time:  Monday March 07 2017 14:24:04 EDT Ventricular Rate:  85 PR Interval:    QRS Duration: 87 QT Interval:  366 QTC Calculation: 436 R Axis:   56 Text Interpretation:  Sinus rhythm RSR' in V1 or V2, right VCD or RVH No acute changes Confirmed by Brantley Stage [phone]) on 03/07/2017 3:16:87 PM      61 year old female who presents with syncope. History of DM and breast cancer s/p left mastectomy, in remission. 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 No chest pain, dyspnea, LE edema or pain, n/v/d, GI bleeding, or palpitations. Normal PO intake earlier today.  Well appearing. Mentation normal. Vitals signs normal, but was told by EMS that her BP was lower when they first arrived on scene. Exam non-focal. EKG without stigmata of arrhythmia. Low suspicion for serious causes such as cardiogenic/arrhythmogenic etiology.   Blood work reassuring. Troponin negative. Not suspecting ACS. No anemia. Dimer positive but age-adjusted is within normal limits. Low suspicion for PE. She is felt stable for discharge home. Strict return and follow-up instructions reviewed. She expressed understanding of all discharge instructions and felt comfortable with the plan of care.    Forde Dandy, MD 03/08/17 [phone]

## 2017-03-07 NOTE — ED Notes (Signed)
Patient verbalizes understanding of discharge instructions. Opportunity for questioning and answers were provided. 

## 2017-03-07 NOTE — ED Notes (Signed)
ED Provider at bedside. 

## 2017-09-24 ENCOUNTER — Ambulatory Visit (INDEPENDENT_AMBULATORY_CARE_PROVIDER_SITE_OTHER): Payer: BC Managed Care – PPO | Admitting: Physician Assistant

## 2017-09-24 ENCOUNTER — Encounter: Payer: Self-pay | Admitting: Physician Assistant

## 2017-09-24 ENCOUNTER — Other Ambulatory Visit: Payer: Self-pay

## 2017-09-24 ENCOUNTER — Ambulatory Visit (INDEPENDENT_AMBULATORY_CARE_PROVIDER_SITE_OTHER): Payer: BC Managed Care – PPO

## 2017-09-24 VITALS — BP 149/74 | HR 81 | Temp 98.3°F | Resp 16 | Ht 65.5 in | Wt 222.2 lb

## 2017-09-24 DIAGNOSIS — M25531 Pain in right wrist: Secondary | ICD-10-CM

## 2017-09-24 DIAGNOSIS — S62101A Fracture of unspecified carpal bone, right wrist, initial encounter for closed fracture: Secondary | ICD-10-CM | POA: Diagnosis not present

## 2017-09-24 DIAGNOSIS — W19XXXA Unspecified fall, initial encounter: Secondary | ICD-10-CM

## 2017-09-24 MED ORDER — TRAMADOL HCL 50 MG PO TABS: 50.0000 mg | ORAL_TABLET | Freq: Three times a day (TID) | ORAL | 0 refills | Status: DC

## 2017-09-24 NOTE — Progress Notes (Signed)
09/25/2017 1:43 PM   DOB: 09/30/1955 / MRN: [id number]  SUBJECTIVE:  [name] is a 62 y.o. female presenting for a fall on outstretched hand that occurred yesterday with a tripping incident.  Denies numbness distal the injury.    She is allergic to other; multihance [gadobenate dimeglumine]; amoxicillin [amoxicillin]; and kiwi extract.   She  has a past medical history of Abnormal Pap smear (05/25/1991), Anemia, Bladder infection, Breast cancer, left ([place]), Depression (07/1991), Diabetes mellitus without complication ([place]), Fibroids, H/O hyperlipidemia, H/O menorrhagia, Headache(784.0), Oligomenorrhea, Ovarian cyst, Pain, female pelvic, and Varicose veins.    She  reports that she quit smoking about 36 years ago. She does not have any smokeless tobacco history on file. She reports that she drinks about 0.5 - 1.0 oz of alcohol per week. She reports that she does not use drugs. She  reports that she currently engages in sexual activity. She reports using the following method of birth control/protection: Post-menopausal. The patient  has a past surgical history that includes lysis of adhesions (Resolved 10/26/90); Colposcopy (06/1991); Tubal ligation (1997); Mastectomy (2008); Hysteroscopy w/D&C (07/20/2007); Diagnostic laparoscopy (1994); and Breast surgery.  Her family history includes Cancer in her other; Diabetes in her other and sister; Hypertension in her other and sister.  Review of Systems  Constitutional: Negative for diaphoresis.  Respiratory: Negative for cough, hemoptysis, sputum production, shortness of breath and wheezing.   Cardiovascular: Negative for chest pain, orthopnea and leg swelling.  Gastrointestinal: Negative for nausea.  Musculoskeletal: Positive for falls and joint pain.  Neurological: Negative for dizziness.    The problem list and medications were reviewed and updated by myself where necessary and exist elsewhere in the encounter.   OBJECTIVE:  BP (!)  149/74   Pulse 81   Temp 98.3 F (36.8 C)   Resp 16   Ht 5' 5.5" (1.664 m)   Wt 222 lb 3.2 oz (100.8 kg)   SpO2 99%   BMI 36.41 kg/m   Physical Exam  Constitutional: She is oriented to person, place, and time. She appears well-nourished. No distress.  Eyes: Pupils are equal, round, and reactive to light. EOM are normal.  Cardiovascular: Normal rate.  Pulmonary/Chest: Effort normal.  Abdominal: She exhibits no distension.  Musculoskeletal: She exhibits tenderness (Right distal radius with mild volar deformity. Mild swelling is generalized. Negative bruising.   Radial pulse 2+.  Sensation intact distally. ).  Neurological: She is alert and oriented to person, place, and time. No cranial nerve deficit. Gait normal.  Skin: Skin is dry. She is not diaphoretic.  Psychiatric: She has a normal mood and affect.  Vitals reviewed.   No results found for this or any previous visit (from the past 72 hour(s)).  Dg Elbow 2 Views Right  Result Date: 09/24/2017 CLINICAL DATA:  RIGHT elbow pain post fall EXAM: RIGHT ELBOW - 2 VIEW COMPARISON:  None FINDINGS: Osseous mineralization normal. Joint spaces preserved. Small epicondylar spurs medially and laterally. No fracture, dislocation, or bone destruction. No joint effusion. IMPRESSION: No acute osseous abnormalities. Electronically Signed   By: [name] M.D.   On: 09/24/2017 15:28   Dg Wrist Complete Right  Result Date: 09/24/2017 CLINICAL DATA:  Wrist pain post fall EXAM: RIGHT WRIST - COMPLETE 3+ VIEW COMPARISON:  None FINDINGS: Osseous mineralization normal. Degenerative changes at first [facility] joint. Remaining joint spaces preserved. Nondisplaced transverse metaphyseal fracture distal RIGHT radius. Minimally displaced fracture at tip of RIGHT ulnar styloid process. No additional fracture, dislocation,  or bone destruction. IMPRESSION: Nondisplaced transverse fracture of the distal RIGHT radial metaphysis. Minimally displaced fracture at tip of RIGHT  ulnar styloid process. Electronically Signed   By: [name] M.D.   On: 09/24/2017 15:27    ASSESSMENT AND PLAN:  [name] was seen today for fall.  Diagnoses and all orders for this visit:  Fall, initial encounter  Acute pain of right wrist -     DG Wrist Complete Right; Future -     DG Elbow 2 Views Right; Future  Closed fracture of right wrist, initial encounter: NVB intact distal the injury.  Placed in a soft removal thumb spica and arm sling.  She needs to see ortho in the next few days.   -     Ambulatory referral to Orthopedic Surgery  Other orders -     traMADol (ULTRAM) 50 MG tablet; Take 1 tablet (50 mg total) by mouth 3 (three) times daily.    The patient is advised to call or return to clinic if she does not see an improvement in symptoms, or to seek the care of the closest emergency department if she worsens with the above plan.   Philis Fendt, MHS, PA-C [facility] 09/25/2017 1:43 PM

## 2017-09-24 NOTE — Patient Instructions (Addendum)
Please place ice right onto the splint often.  Don't take off your splint until you see hand.  Leave you sling on when ambulating.  You should receive a call regarding the referral on Monday.     IF you received an x-ray today, you will receive an invoice from [facility]. Please contact [facility] at [phone] with questions or concerns regarding your invoice.   IF you received labwork today, you will receive an invoice from [facility]. Please contact [facility] at [phone] with questions or concerns regarding your invoice.   Our billing staff will not be able to assist you with questions regarding bills from these companies.  You will be contacted with the lab results as soon as they are available. The fastest way to get your results is to activate your My Chart account. Instructions are located on the last page of this paperwork. If you have not heard from [place] regarding the results in 2 weeks, please contact this office.

## 2017-10-15 ENCOUNTER — Encounter (HOSPITAL_COMMUNITY): Payer: Self-pay | Admitting: Emergency Medicine

## 2017-10-15 ENCOUNTER — Emergency Department (HOSPITAL_COMMUNITY)
Admission: EM | Admit: 2017-10-15 | Discharge: 2017-10-16 | Disposition: A | Discharge status: Home / Self Care | Payer: BC Managed Care – PPO | Attending: Emergency Medicine | Admitting: Emergency Medicine

## 2017-10-15 ENCOUNTER — Other Ambulatory Visit: Payer: Self-pay

## 2017-10-15 DIAGNOSIS — Z853 Personal history of malignant neoplasm of breast: Secondary | ICD-10-CM | POA: Diagnosis not present

## 2017-10-15 DIAGNOSIS — S8991XA Unspecified injury of right lower leg, initial encounter: Secondary | ICD-10-CM | POA: Diagnosis present

## 2017-10-15 DIAGNOSIS — Y9389 Activity, other specified: Secondary | ICD-10-CM | POA: Insufficient documentation

## 2017-10-15 DIAGNOSIS — Z9012 Acquired absence of left breast and nipple: Secondary | ICD-10-CM | POA: Insufficient documentation

## 2017-10-15 DIAGNOSIS — Y999 Unspecified external cause status: Secondary | ICD-10-CM | POA: Diagnosis not present

## 2017-10-15 DIAGNOSIS — Z87891 Personal history of nicotine dependence: Secondary | ICD-10-CM | POA: Diagnosis not present

## 2017-10-15 DIAGNOSIS — Y92410 Unspecified street and highway as the place of occurrence of the external cause: Secondary | ICD-10-CM | POA: Diagnosis not present

## 2017-10-15 DIAGNOSIS — Z7984 Long term (current) use of oral hypoglycemic drugs: Secondary | ICD-10-CM | POA: Insufficient documentation

## 2017-10-15 DIAGNOSIS — S8001XA Contusion of right knee, initial encounter: Secondary | ICD-10-CM

## 2017-10-15 DIAGNOSIS — E119 Type 2 diabetes mellitus without complications: Secondary | ICD-10-CM | POA: Insufficient documentation

## 2017-10-15 DIAGNOSIS — Z79899 Other long term (current) drug therapy: Secondary | ICD-10-CM | POA: Insufficient documentation

## 2017-10-15 NOTE — ED Notes (Signed)
Bed: WTR5 Expected date:  Expected time:  Means of arrival:  Comments: 
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
## 2017-10-16 ENCOUNTER — Emergency Department (HOSPITAL_COMMUNITY): Payer: BC Managed Care – PPO

## 2017-10-16 MED ORDER — CYCLOBENZAPRINE HCL 10 MG PO TABS: 10.0000 mg | ORAL_TABLET | Freq: Two times a day (BID) | ORAL | 0 refills | Status: DC | PRN

## 2017-10-16 MED ORDER — IBUPROFEN 600 MG PO TABS: 600.0000 mg | ORAL_TABLET | Freq: Four times a day (QID) | ORAL | 0 refills | Status: DC | PRN
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
## 2017-10-16 NOTE — Discharge Instructions (Addendum)
Your x-rays are normal and do not show any fracture injuries. Apply ice to the swollen knee to reduce swelling. Take medications as prescribed. Follow up with your doctor if pain persists without improving over the next several days. Return here as needed.

## 2018-11-30 ENCOUNTER — Other Ambulatory Visit (HOSPITAL_COMMUNITY): Payer: Self-pay | Admitting: Internal Medicine

## 2018-11-30 DIAGNOSIS — I358 Other nonrheumatic aortic valve disorders: Secondary | ICD-10-CM

## 2018-12-04 ENCOUNTER — Other Ambulatory Visit (HOSPITAL_COMMUNITY): Payer: BC Managed Care – PPO

## 2018-12-04 ENCOUNTER — Other Ambulatory Visit: Payer: Self-pay

## 2018-12-05 ENCOUNTER — Ambulatory Visit (HOSPITAL_COMMUNITY): Payer: BC Managed Care – PPO | Attending: Cardiology

## 2018-12-05 DIAGNOSIS — I358 Other nonrheumatic aortic valve disorders: Secondary | ICD-10-CM | POA: Insufficient documentation

## 2018-12-14 ENCOUNTER — Telehealth: Payer: Self-pay

## 2018-12-14 NOTE — Telephone Encounter (Signed)
NOTES ON FILE FROM [facility] [phone] REFERRAL SENT TO SCHEDULING

## 2019-04-11 DIAGNOSIS — R69 Illness, unspecified: Secondary | ICD-10-CM | POA: Diagnosis not present

## 2019-05-01 DIAGNOSIS — I10 Essential (primary) hypertension: Secondary | ICD-10-CM | POA: Diagnosis not present

## 2019-05-01 DIAGNOSIS — E8770 Fluid overload, unspecified: Secondary | ICD-10-CM | POA: Diagnosis not present

## 2019-05-01 DIAGNOSIS — E1169 Type 2 diabetes mellitus with other specified complication: Secondary | ICD-10-CM | POA: Diagnosis not present

## 2019-05-01 DIAGNOSIS — R600 Localized edema: Secondary | ICD-10-CM | POA: Diagnosis not present

## 2019-05-01 DIAGNOSIS — E7800 Pure hypercholesterolemia, unspecified: Secondary | ICD-10-CM | POA: Diagnosis not present

## 2019-05-07 ENCOUNTER — Ambulatory Visit: Payer: BC Managed Care – PPO | Admitting: Podiatry

## 2019-05-07 ENCOUNTER — Other Ambulatory Visit: Payer: Self-pay

## 2019-05-07 ENCOUNTER — Encounter: Payer: Self-pay | Admitting: Podiatry

## 2019-05-07 ENCOUNTER — Ambulatory Visit: Payer: Medicare HMO | Admitting: Podiatry

## 2019-05-07 DIAGNOSIS — M79674 Pain in right toe(s): Secondary | ICD-10-CM | POA: Diagnosis not present

## 2019-05-07 DIAGNOSIS — E119 Type 2 diabetes mellitus without complications: Secondary | ICD-10-CM | POA: Diagnosis not present

## 2019-05-07 DIAGNOSIS — M79675 Pain in left toe(s): Secondary | ICD-10-CM

## 2019-05-07 DIAGNOSIS — B351 Tinea unguium: Secondary | ICD-10-CM

## 2019-05-07 NOTE — Progress Notes (Signed)
This patient presents to the office with chief complaint of long thick nails and diabetic feet.  This patient  says there  is  no pain and discomfort in her  feet.  This patient says there are long thick painful nails.  These nails are painful walking and wearing shoes.  Patient has no history of infection or drainage from both feet.  Patient is unable to  self treat his own nails . This patient presents  to the office today for treatment of the  long nails and a foot evaluation due to history of  diabetes.  General Appearance  Alert, conversant and in no acute stress.  Vascular  Dorsalis pedis and posterior tibial  pulses are palpable  bilaterally.  Capillary return is within normal limits  bilaterally. Temperature is within normal limits  bilaterally.  Neurologic  Senn-Weinstein monofilament wire test within normal limits  bilaterally. Muscle power within normal limits bilaterally.  Nails Thick disfigured discolored nails with subungual debris  from hallux to fifth toes bilaterally. No evidence of bacterial infection or drainage bilaterally.  Orthopedic  No limitations of motion of motion feet .  No crepitus or effusions noted.  No bony pathology or digital deformities noted.  HAV  B/L.  Skin  normotropic skin with no porokeratosis noted bilaterally.  No signs of infections or ulcers noted.   Asymptomatic pinch callus  B/L.  Hard dry skin medial ankle and feet  B/L.  Onychomycosis  Diabetes with no foot complications  IE  Debride nails x 10.  A diabetic foot exam was performed and there is no evidence of any vascular or neurologic pathology. Told her to use vaseline to soften her hard skin.  RTC 3 months.   Gardiner Barefoot DPM

## 2019-05-16 DIAGNOSIS — R69 Illness, unspecified: Secondary | ICD-10-CM | POA: Diagnosis not present

## 2019-06-07 DIAGNOSIS — R69 Illness, unspecified: Secondary | ICD-10-CM | POA: Diagnosis not present

## 2019-08-09 ENCOUNTER — Ambulatory Visit: Payer: Self-pay | Admitting: Podiatry

## 2019-09-27 ENCOUNTER — Encounter (HOSPITAL_COMMUNITY): Payer: Self-pay

## 2019-09-27 ENCOUNTER — Ambulatory Visit (HOSPITAL_COMMUNITY)
Admission: EM | Admit: 2019-09-27 | Discharge: 2019-09-27 | Disposition: A | Discharge status: Home / Self Care | Payer: Medicare PPO | Attending: Physician Assistant | Admitting: Physician Assistant

## 2019-09-27 ENCOUNTER — Other Ambulatory Visit: Payer: Self-pay

## 2019-09-27 DIAGNOSIS — R21 Rash and other nonspecific skin eruption: Secondary | ICD-10-CM

## 2019-09-27 DIAGNOSIS — S61211A Laceration without foreign body of left index finger without damage to nail, initial encounter: Secondary | ICD-10-CM | POA: Diagnosis not present

## 2019-09-27 MED ORDER — LIDOCAINE HCL 2 % IJ SOLN
INTRAMUSCULAR | Status: AC
  Filled 2019-09-27: qty 20

## 2019-09-27 MED ORDER — TETANUS-DIPHTH-ACELL PERTUSSIS 5-2.5-18.5 LF-MCG/0.5 IM SUSP
0.5000 mL | Freq: Once | INTRAMUSCULAR | Status: AC
  Administered 2019-09-27: 0.5 mL via INTRAMUSCULAR

## 2019-09-27 MED ORDER — CLOTRIMAZOLE 1 % EX CREA: TOPICAL_CREAM | CUTANEOUS | 0 refills | Status: DC

## 2019-09-27 MED ORDER — TETANUS-DIPHTH-ACELL PERTUSSIS 5-2.5-18.5 LF-MCG/0.5 IM SUSP
INTRAMUSCULAR | Status: AC
  Filled 2019-09-27: qty 0.5

## 2019-09-27 NOTE — ED Provider Notes (Signed)
[place]    CSN: [id number] Arrival date & time: 09/27/19  1805      History   Chief Complaint Chief Complaint  Patient presents with  . Laceration    HPI [name] is a 64 y.o. female.   Patient reports for evaluation of laceration to her left hand.  She reports this occurred earlier using a food cutter in her kitchen.  She reports it bled a little bit and she washed it out and bandaged and came to urgent care.  She is unsure when her last tetanus shot was.  She is also concerned about a rash that has been on her face.  She reports she has had issues with this on and off over the years.  She reports she will get a white crusting starting in her nose and will go down past her mouth occasionally.  She reports is itchy and she will pick at it.  She reports this causes some redness.  She reports her primary care is previously thought this might have been fungal.  Denies pain.  Denies any new skin exposures.  She is also concerned about areas of redness where she believes patch she wears for incontinence of been rubbing.  She reports this is at the upper portion of her buttocks.  She denies any drainage or significant pain.  She reports is itchy occasionally and just more irritated.  Patient reports good primary care follow-up.     Past Medical History:  Diagnosis Date  . Abnormal Pap smear 05/25/1991  . Anemia   . Bladder infection   . Breast cancer, left ([place])    survivor; remission since 2008  . Depression 07/1991   postpartum depression; non currently;   . Diabetes mellitus without complication ([facility])   . Fibroids    h/o  . H/O hyperlipidemia   . H/O menorrhagia   . Headache(784.0)   . Oligomenorrhea    h/o  . Ovarian cyst    h/o asymptomatic  . Pain, female pelvic    h/o  . Varicose veins    Mother    Patient Active Problem List   Diagnosis Date Noted  . Pain due to onychomycosis of toenails of both feet 05/07/2019  . Diabetes mellitus  without complication ([place]) 123XX123  . Malignant neoplasm of overlapping sites of left breast in female, estrogen receptor positive ([place]) 08/12/2016  . Ovarian cyst   . H/O hyperlipidemia   . Pain, female pelvic   . Fibroids   . Pelvic pain 09/27/2011  . S/P endometrial ablation 09/27/2011  . Chronic PID (chronic pelvic inflammatory disease) 09/23/2011  . Pelvic adhesions 09/23/2011  . Spasm of colon 09/23/2011    Past Surgical History:  Procedure Laterality Date  . BREAST SURGERY    . COLPOSCOPY  06/1991  . DIAGNOSTIC LAPAROSCOPY  1994  . HYSTEROSCOPY WITH D & C  07/20/2007  . lysis of adhesions  Resolved 10/26/90   For pelvic adhesions  . MASTECTOMY  2008   left breast  . TUBAL LIGATION  1997    OB History    Gravida  2   Para  2   Term  2   Preterm  0   AB  0   Living  2     SAB  0   TAB  0   Ectopic  0   Multiple  0   Live Births  Home Medications    Prior to Admission medications   Medication Sig Start Date End Date Taking? Authorizing Provider  albuterol (PROVENTIL HFA;VENTOLIN HFA) 108 (90 Base) MCG/ACT inhaler Inhale 2 puffs into the lungs every 4 (four) hours as needed for wheezing or shortness of breath (cough, shortness of breath or wheezing.). Patient not taking: Reported on 03/07/2017 12/13/15   Scot Jun, FNP  amLODipine (NORVASC) 5 MG tablet TAKE 0.5 TABLETS (2.5 MG TOTAL) BY MOUTH DAILY. Patient not taking: Reported on 03/07/2017 02/24/16   Shawnee Knapp, MD  atorvastatin (LIPITOR) 10 MG tablet Take 10 mg by mouth daily.    [provider]  clotrimazole (LOTRIMIN) 1 % cream Apply to affected area 2 times daily 09/27/19   Atha Mcbain, Marguerita Beards, PA-C  cyclobenzaprine (FLEXERIL) 10 MG tablet Take 1 tablet (10 mg total) by mouth 2 (two) times daily as needed for muscle spasms. 10/16/17   Charlann Lange, PA-C  ibuprofen (ADVIL,MOTRIN) 600 MG tablet Take 1 tablet (600 mg total) by mouth every 6 (six) hours as needed. 10/16/17    Charlann Lange, PA-C  metFORMIN (GLUCOPHAGE) 1000 MG tablet Take 1,000 mg by mouth 2 (two) times daily with a meal. 03/04/17   [provider]  metroNIDAZOLE (METROGEL) 1 % gel Apply topically daily. Patient not taking: Reported on 03/07/2017 02/24/16   Shawnee Knapp, MD  mirtazapine (REMERON) 15 MG tablet Take 15 mg by mouth at bedtime as needed. 02/09/17   [provider]  tamoxifen (NOLVADEX) 20 MG tablet TAKE 1 TABLET (20 MG TOTAL) BY MOUTH DAILY. Patient not taking: Reported on 03/07/2017 10/24/15   Magrinat, Virgie Dad, MD  traMADol (ULTRAM) 50 MG tablet Take 1 tablet (50 mg total) by mouth 3 (three) times daily. 09/24/17   Tereasa Coop, PA-C    Family History Family History  Problem Relation Age of Onset  . Cancer Other   . Hypertension Other   . Diabetes Other   . Diabetes Sister   . Hypertension Sister     Social History Social History   Tobacco Use  . Smoking status: Former Smoker    Quit date: 06/08/1981    Years since quitting: 38.3  . Smokeless tobacco: Never Used  Substance Use Topics  . Alcohol use: Not Currently  . Drug use: No     Allergies   Other, Multihance [gadobenate dimeglumine], Amoxicillin, Amoxicillin [amoxicillin], Contrast media  [iodinated diagnostic agents], Gluten meal, and Kiwi extract   Review of Systems Review of Systems Per HPI  Physical Exam Triage Vital Signs ED Triage Vitals  Enc Vitals Group     BP 09/27/19 1818 126/72     Pulse Rate 09/27/19 1818 94     Resp 09/27/19 1818 18     Temp 09/27/19 1818 99 F (37.2 C)     Temp Source 09/27/19 1818 Oral     SpO2 09/27/19 1818 100 %     Weight 09/27/19 1817 220 lb (99.8 kg)     Height --      Head Circumference --      Peak Flow --      Pain Score 09/27/19 1817 2     Pain Loc --      Pain Edu? --      Excl. in [place]? --    No data found.  Updated Vital Signs BP 126/72 (BP Location: Right Arm)   Pulse 94   Temp 99 F (37.2 C) (Oral)   Resp 18  Wt 224 lb  3.2 oz (101.7 kg)   SpO2 100%   BMI 36.19 kg/m   Visual Acuity Right Eye Distance:   Left Eye Distance:   Bilateral Distance:    Right Eye Near:   Left Eye Near:    Bilateral Near:     Physical Exam Vitals and nursing note reviewed.  Constitutional:      General: She is not in acute distress.    Appearance: Normal appearance. She is not ill-appearing.  HENT:     Head: Normocephalic and atraumatic.     Comments: In the nasolabial fold service white crusting.  There are some erythematous patches following the nasolabial fold down past the angle of the mouth.  There is some slight scaling crusting in this area as well where she has been picking.   The upper buttocks at the gluteal cleft there is an area of irritation.  No drainage induration or sign of abscess.  Nontender to touch. Cardiovascular:     Rate and Rhythm: Normal rate.  Pulmonary:     Effort: Pulmonary effort is normal. No respiratory distress.  Musculoskeletal:       Hands:     Comments: Area marked is approximately 1.5 cm superficial laceration.  No foreign object   Patient has full range of motion of the left hand.  Able to make a complete fist.  Skin:    General: Skin is warm and dry.  Neurological:     Mental Status: She is alert.      UC Treatments / Results  Labs (all labs ordered are listed, but only abnormal results are displayed) Labs Reviewed - No data to display  EKG   Radiology No results found.  Procedures Laceration Repair  Date/Time: 09/27/2019 9:11 PM Performed by: Purnell Shoemaker, PA-C Authorized by: Purnell Shoemaker, PA-C   Consent:    Consent obtained:  Verbal   Consent given by:  Patient   Risks discussed:  Poor cosmetic result, poor wound healing, infection and pain   Alternatives discussed:  Delayed treatment Anesthesia (see MAR for exact dosages):    Anesthesia method:  Local infiltration   Local anesthetic:  Lidocaine 2% w/o epi Laceration details:    Location:   Finger   Finger location:  L index finger   Length (cm):  1.5   Depth (mm):  3 Repair type:    Repair type:  Simple Pre-procedure details:    Preparation:  Patient was prepped and draped in usual sterile fashion Exploration:    Wound exploration: wound explored through full range of motion     Wound extent: no foreign bodies/material noted, no muscle damage noted and no tendon damage noted   Treatment:    Area cleansed with:  Saline   Amount of cleaning:  Standard   Irrigation solution:  Sterile saline   Irrigation volume:  40 mL   Irrigation method:  Syringe Skin repair:    Repair method:  Sutures   Suture size:  4-0   Suture material:  Prolene   Number of sutures:  3 Approximation:    Approximation:  Close Post-procedure details:    Dressing:  Antibiotic ointment and non-adherent dressing   Patient tolerance of procedure:  Tolerated well, no immediate complications   (including critical care time)  Medications Ordered in UC Medications  Tdap (BOOSTRIX) injection 0.5 mL (0.5 mLs Intramuscular Given 09/27/19 1951)    Initial Impression / Assessment and Plan / UC Course  I have reviewed  the triage vital signs and the nursing notes.  Pertinent labs & imaging results that were available during my care of the patient were reviewed by me and considered in my medical decision making (see chart for details).     #Finger laceration #Facial rash Patient 64 year old with finger laceration and a rash to the face.  Wound closed nicely with 3 sutures.  Discussed given she has had response to antifungals in the past will try clotrimazole and have her follow-up with her primary care next week for reevaluation.  Discussed use of topical triple antibiotic ointment and friction reducers on the area of irritation in her buttocks.  Also would like her to follow-up with her primary care for this.  Discussed that she should schedule follow-up 7 to 10 days for both wound check and her other  complaints.  Discussed signs of infection.  Patient verbalized understanding of the plan.   Final Clinical Impressions(s) / UC Diagnoses   Final diagnoses:  Laceration of left index finger without foreign body without damage to nail, initial encounter  Rash of face     Discharge Instructions     Continue to keep the wound bandaged.  Clean it daily with soap and water.  You may apply topical antibiotic ointments such as Neosporin or bacitracin.  The rash on your face when she is try the clotrimazole twice a day.  For the rash top of your buttocks, to to try Vaseline and a topical antibiotic.  I would also like for you to attempt to minimize the amount of friction in this area.  Schedule follow-up with your primary care for suture removal and checkup on these complaints in about 10 days.  If you notice spreading redness, purulent discharge from the wound or have a fever please return for reevaluation.      ED Prescriptions    Medication Sig Dispense Auth. Provider   clotrimazole (LOTRIMIN) 1 % cream Apply to affected area 2 times daily 15 g [place], Marguerita Beards, PA-C     PDMP not reviewed this encounter.   Purnell Shoemaker, PA-C 09/27/19 2114

## 2019-09-27 NOTE — Discharge Instructions (Signed)
Continue to keep the wound bandaged.  Clean it daily with soap and water.  You may apply topical antibiotic ointments such as Neosporin or bacitracin.  The rash on your face when she is try the clotrimazole twice a day.  For the rash top of your buttocks, to to try Vaseline and a topical antibiotic.  I would also like for you to attempt to minimize the amount of friction in this area.  Schedule follow-up with your primary care for suture removal and checkup on these complaints in about 10 days.  If you notice spreading redness, purulent discharge from the wound or have a fever please return for reevaluation.

## 2019-09-27 NOTE — ED Triage Notes (Signed)
Pt states she has a laceration on her left hand this happened today  and she has a rash on her face  It's been itching and flaky x 1 weeks.

## 2019-10-04 ENCOUNTER — Other Ambulatory Visit: Payer: Self-pay

## 2019-10-04 ENCOUNTER — Encounter (HOSPITAL_COMMUNITY): Payer: Self-pay

## 2019-10-04 ENCOUNTER — Ambulatory Visit (HOSPITAL_COMMUNITY)
Admission: EM | Admit: 2019-10-04 | Discharge: 2019-10-04 | Disposition: A | Discharge status: Home / Self Care | Payer: Medicare PPO | Attending: Emergency Medicine | Admitting: Emergency Medicine

## 2019-10-04 DIAGNOSIS — R21 Rash and other nonspecific skin eruption: Secondary | ICD-10-CM | POA: Diagnosis not present

## 2019-10-04 DIAGNOSIS — Z760 Encounter for issue of repeat prescription: Secondary | ICD-10-CM | POA: Diagnosis not present

## 2019-10-04 MED ORDER — CLOTRIMAZOLE 1 % EX CREA: TOPICAL_CREAM | CUTANEOUS | 0 refills | Status: DC

## 2019-10-04 NOTE — ED Triage Notes (Signed)
Pt wants a refill on the lotrimin cream

## 2019-10-04 NOTE — Discharge Instructions (Addendum)
Put the cream on for 1 week after the rash resolves and then you can stop it.

## 2019-10-04 NOTE — ED Provider Notes (Signed)
HPI  SUBJECTIVE:  [name] is a 64 y.o. female who presents requesting more Lotrimin and for suture removal.  She was seen here 1 week ago for a laceration of the left index finger and was also treated for a facial rash which was thought to be fungal.  Patient states that she is doing much better, that the scabs on her face have resolved.  She was not prescribed any topical steroids.  PMD: Dr. [name].  She has an appointment with him at the end of June.  Suture removal was addressed by staff.  Past Medical History:  Diagnosis Date  . Abnormal Pap smear 05/25/1991  . Anemia   . Bladder infection   . Breast cancer, left ([place])    survivor; remission since 2008  . Depression 07/1991   postpartum depression; non currently;   . Diabetes mellitus without complication ([place])   . Fibroids    h/o  . H/O hyperlipidemia   . H/O menorrhagia   . Headache(784.0)   . Oligomenorrhea    h/o  . Ovarian cyst    h/o asymptomatic  . Pain, female pelvic    h/o  . Varicose veins    Mother    Past Surgical History:  Procedure Laterality Date  . BREAST SURGERY    . COLPOSCOPY  06/1991  . DIAGNOSTIC LAPAROSCOPY  1994  . HYSTEROSCOPY WITH D & C  07/20/2007  . lysis of adhesions  Resolved 10/26/90   For pelvic adhesions  . MASTECTOMY  2008   left breast  . TUBAL LIGATION  1997    Family History  Problem Relation Age of Onset  . Cancer Other   . Hypertension Other   . Diabetes Other   . Diabetes Sister   . Hypertension Sister     Social History   Tobacco Use  . Smoking status: Former Smoker    Quit date: 06/08/1981    Years since quitting: 38.3  . Smokeless tobacco: Never Used  Substance Use Topics  . Alcohol use: Not Currently  . Drug use: No    No current facility-administered medications for this encounter.  Current Outpatient Medications:  .  albuterol (PROVENTIL HFA;VENTOLIN HFA) 108 (90 Base) MCG/ACT inhaler, Inhale 2 puffs into the lungs every 4 (four) hours as needed  for wheezing or shortness of breath (cough, shortness of breath or wheezing.). (Patient not taking: Reported on 03/07/2017), Disp: 1 Inhaler, Rfl: 1 .  amLODipine (NORVASC) 5 MG tablet, TAKE 0.5 TABLETS (2.5 MG TOTAL) BY MOUTH DAILY. (Patient not taking: Reported on 03/07/2017), Disp: 90 tablet, Rfl: 0 .  atorvastatin (LIPITOR) 10 MG tablet, Take 10 mg by mouth daily., Disp: , Rfl:  .  clotrimazole (LOTRIMIN) 1 % cream, Apply to affected area 2 times daily, Disp: 35 g, Rfl: 0 .  metFORMIN (GLUCOPHAGE) 1000 MG tablet, Take 750 mg by mouth 2 (two) times daily with a meal. , Disp: , Rfl: 3 .  metroNIDAZOLE (METROGEL) 1 % gel, Apply topically daily. (Patient not taking: Reported on 03/07/2017), Disp: 60 g, Rfl: 0 .  mirtazapine (REMERON) 15 MG tablet, Take 15 mg by mouth at bedtime as needed., Disp: , Rfl: 3 .  tamoxifen (NOLVADEX) 20 MG tablet, TAKE 1 TABLET (20 MG TOTAL) BY MOUTH DAILY. (Patient not taking: Reported on 03/07/2017), Disp: 90 tablet, Rfl: 2  Facility-Administered Medications Ordered in Other Encounters:  .  levonorgestrel (MIRENA) 20 MCG/24HR IUD, , Intrauterine, Once, Haygood, Seymour Bars, MD  Allergies  Allergen Reactions  .  Other Shortness Of Breath    Contrast dye- Throat swelling  . Multihance [Gadobenate Dimeglumine] Hives, Itching and Nausea Only    Pt had breast mri with multihance, was at first nauseated, clammy and dizzy. Then after about 15 minutes had rash,itching and hives. Treated with 50mg  of benadryl at office and seen by dr. Jobe Igo  . Amoxicillin   . Amoxicillin [Amoxicillin] Swelling  . Contrast Media  [Iodinated Diagnostic Agents]   . Gluten Meal   . Kiwi Extract Nausea And Vomiting and Rash     ROS  As noted in HPI.   Physical Exam  BP 115/75 (BP Location: Right Arm)   Pulse 85   Temp 98.3 F (36.8 C) (Oral)   Resp 16   Ht 5\' 6"  (1.676 m)   Wt 92.1 kg   SpO2 96%   BMI 32.77 kg/m   Constitutional: Well developed, well nourished, no acute  distress Eyes:  EOMI, conjunctiva normal bilaterally HENT: Normocephalic, atraumatic,mucus membranes moist Respiratory: Normal inspiratory effort Cardiovascular: Normal rate GI: nondistended skin: No facial rash, skin intact Musculoskeletal: no deformities Neurologic: Alert & oriented x 3, no focal neuro deficits Psychiatric: Speech and behavior appropriate   ED Course   Medications - No data to display  No orders of the defined types were placed in this encounter.   No results found for this or any previous visit (from the past 24 hour(s)). No results found.  ED Clinical Impression  1. Rash   2. Medication refill      ED Assessment/Plan  Patient responded well to the Lotrimin.  Will prescribe her some more.  Advised her that she is to use it until the rash has resolved and then for 1 week after, then she may stop it.  She states that she will follow-up with her primary care physician or a dermatologist if it returns.  Meds ordered this encounter  Medications  . clotrimazole (LOTRIMIN) 1 % cream    Sig: Apply to affected area 2 times daily    Dispense:  35 g    Refill:  0    *This clinic note was created using [employer]. Therefore, there may be occasional mistakes despite careful proofreading.   ?    Melynda Ripple, MD 10/05/19 1511

## 2019-12-17 ENCOUNTER — Ambulatory Visit: Payer: Medicare PPO | Admitting: Podiatry

## 2019-12-17 ENCOUNTER — Other Ambulatory Visit: Payer: Self-pay

## 2019-12-17 ENCOUNTER — Encounter: Payer: Self-pay | Admitting: Podiatry

## 2019-12-17 DIAGNOSIS — M79675 Pain in left toe(s): Secondary | ICD-10-CM | POA: Diagnosis not present

## 2019-12-17 DIAGNOSIS — E119 Type 2 diabetes mellitus without complications: Secondary | ICD-10-CM

## 2019-12-17 DIAGNOSIS — M79674 Pain in right toe(s): Secondary | ICD-10-CM

## 2019-12-17 DIAGNOSIS — B351 Tinea unguium: Secondary | ICD-10-CM | POA: Diagnosis not present

## 2019-12-17 MED ORDER — AMMONIUM LACTATE 12 % EX CREA: TOPICAL_CREAM | CUTANEOUS | 0 refills | Status: DC | PRN

## 2019-12-17 NOTE — Progress Notes (Signed)
This patient returns to my office for at risk foot care.  This patient requires this care by a professional since this patient will be at risk due to having diabetes.    This patient is unable to cut nails herself since the patient cannot reach her nails.These nails are painful walking and wearing shoes.  This patient presents for at risk foot care today.  Patient requests medicine for her crusty heels  B/L.  Patient is concerned about patients her nails being fungus.  Patient has not been seen in over 7 months.  General Appearance  Alert, conversant and in no acute stress.  Vascular  Dorsalis pedis and posterior tibial  pulses are palpable  bilaterally.  Capillary return is within normal limits  bilaterally. Temperature is within normal limits  bilaterally.  Neurologic  Senn-Weinstein monofilament wire test within normal limits  bilaterally. Muscle power within normal limits bilaterally.  Nails Thick disfigured discolored nails with subungual debris  from hallux to fifth toes bilaterally. No evidence of bacterial infection or drainage bilaterally.  Orthopedic  No limitations of motion  feet .  No crepitus or effusions noted.  No bony pathology or digital deformities noted.  Skin  normotropic skin with no porokeratosis noted bilaterally.  No signs of infections or ulcers noted.  Crust heels  B/L.   Onychomycosis  Pain in right toes  Pain in left toes  Crusty heels  B/L  Consent was obtained for treatment procedures.   Mechanical debridement of nails 1-5  bilaterally performed with a nail nipper.  Filed with dremel without incident. Prescribe lac-hydrin for application to both heels. Nail [facility] to  [facility].   Return office visit    3 months                 Told patient to return for periodic foot care and evaluation due to potential at risk complications.   Gardiner Barefoot DPM

## 2019-12-21 ENCOUNTER — Telehealth: Payer: Self-pay | Admitting: *Deleted

## 2019-12-21 NOTE — Telephone Encounter (Signed)
Called patient to inform per Dr. Prudence Davidson that her [facility] labs results came back with no fungus noted and that she may return back to office routine feet care as needed. No answer, unable to leave VM.

## 2020-01-17 ENCOUNTER — Encounter: Payer: Self-pay | Admitting: *Deleted

## 2020-01-22 ENCOUNTER — Telehealth: Payer: Self-pay | Admitting: *Deleted

## 2020-01-22 NOTE — Telephone Encounter (Signed)
Called patient to inform that lab results([place],fungal) per Dr. Prudence Davidson is neg.no answer and could leave Vmessage (full).

## 2020-02-05 ENCOUNTER — Telehealth: Payer: Self-pay | Admitting: Podiatry

## 2020-02-05 NOTE — Telephone Encounter (Signed)
Patient has requested a refill on her foot cream.

## 2020-02-06 NOTE — Telephone Encounter (Signed)
Please advise on refill.

## 2020-02-06 NOTE — Telephone Encounter (Signed)
Complete,patient has been called to inform that results are negative, no cream needed.

## 2020-03-20 ENCOUNTER — Ambulatory Visit: Payer: Medicare PPO | Admitting: Podiatry

## 2020-03-24 ENCOUNTER — Encounter: Payer: Self-pay | Admitting: Podiatry

## 2020-03-24 ENCOUNTER — Ambulatory Visit: Payer: Medicare PPO | Admitting: Podiatry

## 2020-03-24 ENCOUNTER — Other Ambulatory Visit: Payer: Self-pay | Admitting: *Deleted

## 2020-03-24 ENCOUNTER — Other Ambulatory Visit: Payer: Self-pay

## 2020-03-24 DIAGNOSIS — M79674 Pain in right toe(s): Secondary | ICD-10-CM

## 2020-03-24 DIAGNOSIS — B351 Tinea unguium: Secondary | ICD-10-CM

## 2020-03-24 DIAGNOSIS — M79675 Pain in left toe(s): Secondary | ICD-10-CM | POA: Diagnosis not present

## 2020-03-24 DIAGNOSIS — E119 Type 2 diabetes mellitus without complications: Secondary | ICD-10-CM

## 2020-03-24 DIAGNOSIS — L84 Corns and callosities: Secondary | ICD-10-CM

## 2020-03-24 MED ORDER — AMMONIUM LACTATE 12 % EX CREA: TOPICAL_CREAM | CUTANEOUS | 0 refills | Status: DC | PRN

## 2020-03-24 NOTE — Progress Notes (Signed)
This patient returns to my office for at risk foot care.  This patient requires this care by a professional since this patient will be at risk due to having diabetes.    This patient is unable to cut nails herself since the patient cannot reach her nails.These nails are painful walking and wearing shoes.  This patient presents for at risk foot care today.  Patient requests medicine for her crusty heels  B/L since she never received the medicine last visit..  Patient has no evidence of fungus in her nails according to the lab.  General Appearance  Alert, conversant and in no acute stress.  Vascular  Dorsalis pedis and posterior tibial  pulses are palpable  bilaterally.  Capillary return is within normal limits  bilaterally. Temperature is within normal limits  bilaterally.  Neurologic  Senn-Weinstein monofilament wire test within normal limits  bilaterally. Muscle power within normal limits bilaterally.  Nails Thick disfigured discolored nails with subungual debris  from hallux to fifth toes bilaterally. No evidence of bacterial infection or drainage bilaterally.  Orthopedic  No limitations of motion  feet .  No crepitus or effusions noted.  No bony pathology or digital deformities noted.  Skin  normotropic skin with no porokeratosis noted bilaterally.  No signs of infections or ulcers noted.  Crust heels  B/L.   Onychomycosis  Pain in right toes  Pain in left toes  Crusty heels  B/L  Consent was obtained for treatment procedures.   Mechanical debridement of nails 1-5  bilaterally performed with a nail nipper.  Filed with dremel without incident. Prescribe lac-hydrin for application to both heels.   Return office visit    3 months                 Told patient to return for periodic foot care and evaluation due to potential at risk complications.   Gardiner Barefoot DPM

## 2020-04-01 NOTE — Telephone Encounter (Signed)
completed

## 2020-05-07 DIAGNOSIS — C50912 Malignant neoplasm of unspecified site of left female breast: Secondary | ICD-10-CM | POA: Diagnosis not present

## 2020-05-30 DIAGNOSIS — E7800 Pure hypercholesterolemia, unspecified: Secondary | ICD-10-CM | POA: Diagnosis not present

## 2020-05-30 DIAGNOSIS — F909 Attention-deficit hyperactivity disorder, unspecified type: Secondary | ICD-10-CM | POA: Diagnosis not present

## 2020-05-30 DIAGNOSIS — I10 Essential (primary) hypertension: Secondary | ICD-10-CM | POA: Diagnosis not present

## 2020-05-30 DIAGNOSIS — E1169 Type 2 diabetes mellitus with other specified complication: Secondary | ICD-10-CM | POA: Diagnosis not present

## 2020-05-30 DIAGNOSIS — Z7984 Long term (current) use of oral hypoglycemic drugs: Secondary | ICD-10-CM | POA: Diagnosis not present

## 2020-06-26 ENCOUNTER — Ambulatory Visit: Payer: Medicare PPO | Admitting: Podiatry

## 2020-07-03 ENCOUNTER — Other Ambulatory Visit: Payer: Self-pay

## 2020-07-03 ENCOUNTER — Encounter: Payer: Self-pay | Admitting: Podiatry

## 2020-07-03 ENCOUNTER — Ambulatory Visit: Payer: Medicare PPO | Admitting: Podiatry

## 2020-07-03 DIAGNOSIS — M79674 Pain in right toe(s): Secondary | ICD-10-CM

## 2020-07-03 DIAGNOSIS — E119 Type 2 diabetes mellitus without complications: Secondary | ICD-10-CM | POA: Diagnosis not present

## 2020-07-03 DIAGNOSIS — M79675 Pain in left toe(s): Secondary | ICD-10-CM

## 2020-07-03 DIAGNOSIS — L84 Corns and callosities: Secondary | ICD-10-CM

## 2020-07-03 DIAGNOSIS — B351 Tinea unguium: Secondary | ICD-10-CM | POA: Diagnosis not present

## 2020-07-03 MED ORDER — AMMONIUM LACTATE 12 % EX CREA: TOPICAL_CREAM | CUTANEOUS | 2 refills | Status: DC | PRN

## 2020-07-03 NOTE — Progress Notes (Signed)
This patient returns to my office for at risk foot care.  This patient requires this care by a professional since this patient will be at risk due to having diabetes.    This patient is unable to cut nails herself since the patient cannot reach her nails.These nails are painful walking and wearing shoes.  This patient presents for at risk foot care today.  Patient requests a refill on lac-hydrin.  Patient has no evidence of fungus in her nails according to the lab.  General Appearance  Alert, conversant and in no acute stress.  Vascular  Dorsalis pedis and posterior tibial  pulses are palpable  bilaterally.  Capillary return is within normal limits  bilaterally. Temperature is within normal limits  bilaterally.  Neurologic  Senn-Weinstein monofilament wire test within normal limits  bilaterally. Muscle power within normal limits bilaterally.  Nails Thick disfigured discolored nails with subungual debris  from hallux to fifth toes bilaterally. No evidence of bacterial infection or drainage bilaterally.  Orthopedic  No limitations of motion  feet .  No crepitus or effusions noted.  No bony pathology or digital deformities noted.  Skin  normotropic skin with no porokeratosis noted bilaterally.  No signs of infections or ulcers noted.  Crusty heels  B/L.   Onychomycosis  Pain in right toes  Pain in left toes  Crusty heels  B/L  Consent was obtained for treatment procedures.   Mechanical debridement of nails 1-5  bilaterally performed with a nail nipper.  Filed with dremel without incident. Prescribe lac-hydrin for application    Return office visit    3 months                 Told patient to return for periodic foot care and evaluation due to potential at risk complications.   Gardiner Barefoot DPM

## 2020-08-28 ENCOUNTER — Encounter (HOSPITAL_COMMUNITY): Payer: Self-pay

## 2020-08-28 ENCOUNTER — Ambulatory Visit (HOSPITAL_COMMUNITY)
Admission: EM | Admit: 2020-08-28 | Discharge: 2020-08-28 | Disposition: A | Discharge status: Home / Self Care | Payer: Medicare PPO | Attending: Student | Admitting: Student

## 2020-08-28 ENCOUNTER — Other Ambulatory Visit: Payer: Self-pay

## 2020-08-28 DIAGNOSIS — J209 Acute bronchitis, unspecified: Secondary | ICD-10-CM | POA: Diagnosis not present

## 2020-08-28 DIAGNOSIS — Z1152 Encounter for screening for COVID-19: Secondary | ICD-10-CM | POA: Diagnosis not present

## 2020-08-28 MED ORDER — ALBUTEROL SULFATE HFA 108 (90 BASE) MCG/ACT IN AERS: 1.0000 | INHALATION_SPRAY | Freq: Four times a day (QID) | RESPIRATORY_TRACT | 0 refills | Status: DC | PRN

## 2020-08-28 MED ORDER — PROMETHAZINE-DM 6.25-15 MG/5ML PO SYRP: 5.0000 mL | ORAL_SOLUTION | Freq: Four times a day (QID) | ORAL | 0 refills | Status: DC | PRN

## 2020-08-28 MED ORDER — PREDNISONE 20 MG PO TABS: 40.0000 mg | ORAL_TABLET | Freq: Every day | ORAL | 0 refills | Status: AC

## 2020-08-28 NOTE — Discharge Instructions (Addendum)
-  Promethazine DM cough syrup for congestion/cough. This could make you drowsy, so take at night before bed. -Prednisone 2 pills taken at the same time in the morning for 5 days in a row. -Albuterol inhaler as needed for cough and wheezing, up to every 6 hours. -Seek additional medical attention if your symptoms worsen or persist, like new shortness of breath, any fever/chills, chest pain, dizziness.

## 2020-08-28 NOTE — ED Triage Notes (Signed)
Pt c/o chest congestion, cough and sore throat x 5 days. Pt states her sxs get worse at night. Pt states she was coughing up yellow and green mucus. She states the more she coughed her chest started to hurt.

## 2020-08-28 NOTE — ED Provider Notes (Signed)
[name]    CSN: [id number] Arrival date & time: 08/28/20  1843      History   Chief Complaint Chief Complaint  Patient presents with  . Cough  . Sore Throat    HPI [name] is a 65 y.o. female presenting with cough, congestion, sore throat for 5 days.  States the cough gets worse at night.  Describes this as productive with yellow-green mucus.  Some chest wall pain with coughing.  Has tried some over-the-counter medications with minimal relief, she is not sure what these medications are.  Denies shortness of breath, fever/chills, fast heart rate, nausea/vomiting/diarrhea, chest pain, dizziness.  HPI  Past Medical History:  Diagnosis Date  . Abnormal Pap smear 05/25/1991  . Anemia   . Bladder infection   . Breast cancer, left ([place])    survivor; remission since 2008  . Depression 07/1991   postpartum depression; non currently;   . Diabetes mellitus without complication ([place])   . Fibroids    h/o  . H/O hyperlipidemia   . H/O menorrhagia   . Headache(784.0)   . Oligomenorrhea    h/o  . Ovarian cyst    h/o asymptomatic  . Pain, female pelvic    h/o  . Varicose veins    Mother    Patient Active Problem List   Diagnosis Date Noted  . Heel callus 03/24/2020  . Pain due to onychomycosis of toenails of both feet 05/07/2019  . Diabetes mellitus without complication ([place]) 87/56/4332  . Malignant neoplasm of overlapping sites of left breast in female, estrogen receptor positive ([place]) 08/12/2016  . Ovarian cyst   . H/O hyperlipidemia   . Pain, female pelvic   . Fibroids   . Pelvic pain 09/27/2011  . S/P endometrial ablation 09/27/2011  . Chronic PID (chronic pelvic inflammatory disease) 09/23/2011  . Pelvic adhesions 09/23/2011  . Spasm of colon 09/23/2011    Past Surgical History:  Procedure Laterality Date  . BREAST SURGERY    . COLPOSCOPY  06/1991  . DIAGNOSTIC LAPAROSCOPY  1994  . HYSTEROSCOPY WITH D & C  07/20/2007  . lysis of adhesions   Resolved 10/26/90   For pelvic adhesions  . MASTECTOMY  2008   left breast  . TUBAL LIGATION  1997    OB History    Gravida  2   Para  2   Term  2   Preterm  0   AB  0   Living  2     SAB  0   IAB  0   Ectopic  0   Multiple  0   Live Births               Home Medications    Prior to Admission medications   Medication Sig Start Date End Date Taking? Authorizing Provider  albuterol (VENTOLIN HFA) 108 (90 Base) MCG/ACT inhaler Inhale 1-2 puffs into the lungs every 6 (six) hours as needed for wheezing or shortness of breath. 08/28/20  Yes Hazel Sams, PA-C  predniSONE (DELTASONE) 20 MG tablet Take 2 tablets (40 mg total) by mouth daily for 5 days. 08/28/20 09/02/20 Yes Hazel Sams, PA-C  promethazine-dextromethorphan (PROMETHAZINE-DM) 6.25-15 MG/5ML syrup Take 5 mLs by mouth 4 (four) times daily as needed for cough. 08/28/20  Yes Hazel Sams, PA-C  ammonium lactate (LAC-HYDRIN) 12 % cream Apply topically as needed for dry skin. 07/03/20   Gardiner Barefoot, DPM  clotrimazole (LOTRIMIN) 1 % cream Apply  to affected area 2 times daily 10/04/19   Melynda Ripple, MD  losartan (COZAAR) 25 MG tablet 1 tablet    [provider]  metFORMIN (GLUCOPHAGE-XR) 750 MG 24 hr tablet 1 tablet 12/12/19   [provider]  metroNIDAZOLE (METROGEL) 1 % gel Apply topically daily. 02/24/16   Shawnee Knapp, MD  Multiple Vitamin (MULTI-VITAMIN) tablet Take 1 tablet by mouth daily.    [provider]  Omega-3 Fatty Acids (OMEGA-3 FISH OIL PO) Omega 3 Fish Oil    [provider]  tolterodine (DETROL LA) 4 MG 24 hr capsule 1 capsule 03/03/20   [provider]  VITAMIN D PO 5,000 unit capsules    [provider]    Family History Family History  Problem Relation Age of Onset  . Cancer Other   . Hypertension Other   . Diabetes Other   . Diabetes Sister   . Hypertension Sister     Social History Social History   Tobacco Use  .  Smoking status: Former Smoker    Quit date: 06/08/1981    Years since quitting: 39.2  . Smokeless tobacco: Never Used  Vaping Use  . Vaping Use: Never used  Substance Use Topics  . Alcohol use: Not Currently  . Drug use: No     Allergies   Other, Multihance [gadobenate dimeglumine], Amoxicillin, Amoxicillin [amoxicillin], Gluten meal, Iodinated diagnostic agents, and Kiwi extract   Review of Systems Review of Systems  Constitutional: Negative for appetite change, chills and fever.  HENT: Positive for congestion and sore throat. Negative for ear pain, rhinorrhea, sinus pressure and sinus pain.   Eyes: Negative for redness and visual disturbance.  Respiratory: Positive for cough. Negative for chest tightness, shortness of breath and wheezing.   Cardiovascular: Negative for chest pain and palpitations.  Gastrointestinal: Negative for abdominal pain, constipation, diarrhea, nausea and vomiting.  Genitourinary: Negative for dysuria, frequency and urgency.  Musculoskeletal: Negative for myalgias.  Neurological: Negative for dizziness, weakness and headaches.  Psychiatric/Behavioral: Negative for confusion.  All other systems reviewed and are negative.    Physical Exam Triage Vital Signs ED Triage Vitals  Enc Vitals Group     BP 08/28/20 1932 125/74     Pulse Rate 08/28/20 1932 84     Resp 08/28/20 1932 15     Temp 08/28/20 1932 98.3 F (36.8 C)     Temp Source 08/28/20 1932 Oral     SpO2 08/28/20 1932 97 %     Weight --      Height --      Head Circumference --      Peak Flow --      Pain Score 08/28/20 1930 0     Pain Loc --      Pain Edu? --      Excl. in [place]? --    No data found.  Updated Vital Signs BP 125/74 (BP Location: Right Arm)   Pulse 84   Temp 98.3 F (36.8 C) (Oral)   Resp 15   SpO2 97%   Visual Acuity Right Eye Distance:   Left Eye Distance:   Bilateral Distance:    Right Eye Near:   Left Eye Near:    Bilateral Near:     Physical  Exam Vitals reviewed.  Constitutional:      General: She is not in acute distress.    Appearance: Normal appearance. She is not ill-appearing.  HENT:     Head: Normocephalic and atraumatic.  Right Ear: Hearing, tympanic membrane, ear canal and external ear normal. No swelling or tenderness. There is no impacted cerumen. No mastoid tenderness. Tympanic membrane is not perforated, erythematous, retracted or bulging.     Left Ear: Hearing, tympanic membrane, ear canal and external ear normal. No swelling or tenderness. There is no impacted cerumen. No mastoid tenderness. Tympanic membrane is not perforated, erythematous, retracted or bulging.     Nose:     Right Sinus: No maxillary sinus tenderness or frontal sinus tenderness.     Left Sinus: No maxillary sinus tenderness or frontal sinus tenderness.     Mouth/Throat:     Mouth: Mucous membranes are moist.     Pharynx: Uvula midline. No oropharyngeal exudate or posterior oropharyngeal erythema.     Tonsils: No tonsillar exudate.  Cardiovascular:     Rate and Rhythm: Normal rate and regular rhythm.     Heart sounds: Normal heart sounds.  Pulmonary:     Breath sounds: Normal breath sounds and air entry. No wheezing, rhonchi or rales.  Chest:     Chest wall: No tenderness.  Abdominal:     General: Abdomen is flat. Bowel sounds are normal.     Tenderness: There is no abdominal tenderness. There is no guarding or rebound.  Lymphadenopathy:     Cervical: No cervical adenopathy.  Neurological:     General: No focal deficit present.     Mental Status: She is alert and oriented to person, place, and time.  Psychiatric:        Attention and Perception: Attention and perception normal.        Mood and Affect: Mood and affect normal.        Behavior: Behavior normal. Behavior is cooperative.        Thought Content: Thought content normal.        Judgment: Judgment normal.      UC Treatments / Results  Labs (all labs ordered are listed,  but only abnormal results are displayed) Labs Reviewed  SARS CORONAVIRUS 2 (TAT 6-24 HRS)    EKG   Radiology No results found.  Procedures Procedures (including critical care time)  Medications Ordered in UC Medications - No data to display  Initial Impression / Assessment and Plan / UC Course  I have reviewed the triage vital signs and the nursing notes.  Pertinent labs & imaging results that were available during my care of the patient were reviewed by me and considered in my medical decision making (see chart for details).     This patient is a 65 year old female presenting with acute bronchitis following viral URI.  She is afebrile and nontachycardic, no wheezes rhonchi or rales.  Oxygenating well on room air.  Has not taken antipyretic today.  No history of pulmonary disease.  Promethazine, prednisone, albuterol as below.  Tylenol and ibuprofen for fever reduction. Covid PCR sent. ED return precautions discussed.  Final Clinical Impressions(s) / UC Diagnoses   Final diagnoses:  Acute bronchitis, unspecified organism  Encounter for screening for COVID-19     Discharge Instructions     -Promethazine DM cough syrup for congestion/cough. This could make you drowsy, so take at night before bed. -Prednisone 2 pills taken at the same time in the morning for 5 days in a row. -Albuterol inhaler as needed for cough and wheezing, up to every 6 hours. -Seek additional medical attention if your symptoms worsen or persist, like new shortness of breath, any fever/chills, chest pain, dizziness.  ED Prescriptions    Medication Sig Dispense Auth. Provider   albuterol (VENTOLIN HFA) 108 (90 Base) MCG/ACT inhaler Inhale 1-2 puffs into the lungs every 6 (six) hours as needed for wheezing or shortness of breath. 1 each Hazel Sams, PA-C   predniSONE (DELTASONE) 20 MG tablet Take 2 tablets (40 mg total) by mouth daily for 5 days. 10 tablet Hazel Sams, PA-C    promethazine-dextromethorphan (PROMETHAZINE-DM) 6.25-15 MG/5ML syrup Take 5 mLs by mouth 4 (four) times daily as needed for cough. 118 mL Hazel Sams, PA-C     PDMP not reviewed this encounter.   Hazel Sams, PA-C 08/28/20 2018

## 2020-08-29 LAB — SARS CORONAVIRUS 2 (TAT 6-24 HRS): SARS Coronavirus 2: POSITIVE — AB

## 2020-11-03 ENCOUNTER — Ambulatory Visit: Payer: Medicare PPO | Admitting: Podiatry

## 2020-12-12 DIAGNOSIS — E7800 Pure hypercholesterolemia, unspecified: Secondary | ICD-10-CM | POA: Diagnosis not present

## 2020-12-12 DIAGNOSIS — Z7984 Long term (current) use of oral hypoglycemic drugs: Secondary | ICD-10-CM | POA: Diagnosis not present

## 2020-12-12 DIAGNOSIS — E1169 Type 2 diabetes mellitus with other specified complication: Secondary | ICD-10-CM | POA: Diagnosis not present

## 2020-12-12 DIAGNOSIS — Z23 Encounter for immunization: Secondary | ICD-10-CM | POA: Diagnosis not present

## 2020-12-12 DIAGNOSIS — F909 Attention-deficit hyperactivity disorder, unspecified type: Secondary | ICD-10-CM | POA: Diagnosis not present

## 2020-12-12 DIAGNOSIS — Z0000 Encounter for general adult medical examination without abnormal findings: Secondary | ICD-10-CM | POA: Diagnosis not present

## 2020-12-12 DIAGNOSIS — I358 Other nonrheumatic aortic valve disorders: Secondary | ICD-10-CM | POA: Diagnosis not present

## 2020-12-12 DIAGNOSIS — Z79899 Other long term (current) drug therapy: Secondary | ICD-10-CM | POA: Diagnosis not present

## 2020-12-12 DIAGNOSIS — I10 Essential (primary) hypertension: Secondary | ICD-10-CM | POA: Diagnosis not present

## 2020-12-23 DIAGNOSIS — Z1211 Encounter for screening for malignant neoplasm of colon: Secondary | ICD-10-CM | POA: Diagnosis not present

## 2020-12-23 DIAGNOSIS — N3946 Mixed incontinence: Secondary | ICD-10-CM | POA: Diagnosis not present

## 2020-12-23 DIAGNOSIS — Z1231 Encounter for screening mammogram for malignant neoplasm of breast: Secondary | ICD-10-CM | POA: Diagnosis not present

## 2020-12-23 DIAGNOSIS — M8580 Other specified disorders of bone density and structure, unspecified site: Secondary | ICD-10-CM | POA: Diagnosis not present

## 2020-12-23 DIAGNOSIS — Z01419 Encounter for gynecological examination (general) (routine) without abnormal findings: Secondary | ICD-10-CM | POA: Diagnosis not present

## 2020-12-23 DIAGNOSIS — C50919 Malignant neoplasm of unspecified site of unspecified female breast: Secondary | ICD-10-CM | POA: Diagnosis not present

## 2020-12-23 DIAGNOSIS — Z6835 Body mass index (BMI) 35.0-35.9, adult: Secondary | ICD-10-CM | POA: Diagnosis not present

## 2020-12-23 DIAGNOSIS — R011 Cardiac murmur, unspecified: Secondary | ICD-10-CM | POA: Diagnosis not present

## 2020-12-30 ENCOUNTER — Telehealth: Payer: Self-pay

## 2020-12-30 NOTE — Telephone Encounter (Signed)
Referral notes received from [facility], Phone #: [phone], Fax #: [phone]   A copy of the notes have been placed in the scheduling box for check-out to pick-up and to enter referral. Original notes placed in file cabinet.

## 2021-01-14 DIAGNOSIS — N3941 Urge incontinence: Secondary | ICD-10-CM | POA: Diagnosis not present

## 2021-01-23 DIAGNOSIS — Z0000 Encounter for general adult medical examination without abnormal findings: Secondary | ICD-10-CM | POA: Diagnosis not present

## 2021-02-06 DIAGNOSIS — D649 Anemia, unspecified: Secondary | ICD-10-CM | POA: Diagnosis not present

## 2021-02-18 DIAGNOSIS — E785 Hyperlipidemia, unspecified: Secondary | ICD-10-CM | POA: Diagnosis not present

## 2021-02-18 DIAGNOSIS — E119 Type 2 diabetes mellitus without complications: Secondary | ICD-10-CM | POA: Diagnosis not present

## 2021-02-18 DIAGNOSIS — E559 Vitamin D deficiency, unspecified: Secondary | ICD-10-CM | POA: Diagnosis not present

## 2021-02-18 DIAGNOSIS — Z9189 Other specified personal risk factors, not elsewhere classified: Secondary | ICD-10-CM | POA: Diagnosis not present

## 2021-03-04 NOTE — Progress Notes (Signed)
Referring-Ronald Polite MD Reason for referral-murmur  HPI: 65 year old female for evaluation of murmur at request of Seward Carol MD.  Echocardiogram July 2020 showed normal LV function, grade 1 diastolic dysfunction and mildly increased transaortic gradient at 2.1 m/s with mean 9 mmHg.  Laboratories September 2022 showed LDL 136, total cholesterol 213, BUN 12, creatinine 0.73, sodium 139, potassium 4.6, normal liver functions, hemoglobin 12.2, TSH 0.844.  Patient has dyspnea with more vigorous activities but not routine activities.  No orthopnea, PND, pedal edema, exertional chest pain, palpitations or syncope.  Cardiology now asked to evaluate.  Current Outpatient Medications  Medication Sig Dispense Refill   albuterol (VENTOLIN HFA) 108 (90 Base) MCG/ACT inhaler Inhale 1-2 puffs into the lungs every 6 (six) hours as needed for wheezing or shortness of breath. 1 each 0   ammonium lactate (LAC-HYDRIN) 12 % cream Apply topically as needed for dry skin. 385 g 2   atorvastatin (LIPITOR) 10 MG tablet Take 10 mg by mouth daily.     clotrimazole (LOTRIMIN) 1 % cream Apply to affected area 2 times daily 35 g 0   fesoterodine (TOVIAZ) 8 MG TB24 tablet Take by mouth.     furosemide (LASIX) 20 MG tablet furosemide 20 mg tablet     losartan (COZAAR) 25 MG tablet 1 tablet     metFORMIN (GLUCOPHAGE-XR) 750 MG 24 hr tablet 1 tablet     metroNIDAZOLE (METROGEL) 1 % gel Apply topically daily. 60 g 0   Multiple Vitamin (MULTI-VITAMIN) tablet Take 1 tablet by mouth daily.     Omega-3 Fatty Acids (OMEGA-3 FISH OIL PO) Omega 3 Fish Oil     promethazine-dextromethorphan (PROMETHAZINE-DM) 6.25-15 MG/5ML syrup Take 5 mLs by mouth 4 (four) times daily as needed for cough. 118 mL 0   tolterodine (DETROL LA) 4 MG 24 hr capsule 1 capsule     VITAMIN D PO 5,000 unit capsules     No current facility-administered medications for this visit.   Facility-Administered Medications Ordered in Other Visits  Medication  Dose Route Frequency Provider Last Rate Last Admin   levonorgestrel (MIRENA) 20 MCG/24HR IUD   Intrauterine Once Haygood, Seymour Bars, MD        Allergies  Allergen Reactions   Other Shortness Of Breath and Nausea And Vomiting    Contrast dye- Throat swelling   Multihance [Gadobenate Dimeglumine] Hives, Itching and Nausea Only    Pt had breast mri with multihance, was at first nauseated, clammy and dizzy. Then after about 15 minutes had rash,itching and hives. Treated with 50mg  of benadryl at office and seen by dr. Jobe Igo   Amoxicillin Other (See Comments)   Amoxicillin [Amoxicillin] Swelling   Gluten Meal    Iodinated Diagnostic Agents Other (See Comments)   Kiwi Extract Nausea And Vomiting and Rash     Past Medical History:  Diagnosis Date   Abnormal Pap smear 05/25/1991   Anemia    Bladder infection    Breast cancer, left ([place])    survivor; remission since 2008   Depression 07/16/1991   postpartum depression; non currently;    Diabetes mellitus without complication ([place])    Fibroids    h/o   H/O hyperlipidemia    H/O menorrhagia    Headache(784.0)    Hypertension    Oligomenorrhea    h/o   Ovarian cyst    h/o asymptomatic   Pain, female pelvic    h/o   Varicose veins    Mother    Past  Surgical History:  Procedure Laterality Date   BREAST SURGERY     COLPOSCOPY  06/1991   DIAGNOSTIC LAPAROSCOPY  1994   HYSTEROSCOPY WITH D & C  07/20/2007   lysis of adhesions  Resolved 10/26/90   For pelvic adhesions   MASTECTOMY  2008   left breast   TUBAL LIGATION  1997    Social History   Socioeconomic History   Marital status: Married    Spouse name: Not on file   Number of children: 2   Years of education: Not on file   Highest education level: Not on file  Occupational History   Not on file  Tobacco Use   Smoking status: Former    Types: Cigarettes    Quit date: 06/08/1981    Years since quitting: 39.7   Smokeless tobacco: Never  Vaping Use   Vaping Use:  Never used  Substance and Sexual Activity   Alcohol use: Not Currently    Comment: Rare   Drug use: No   Sexual activity: Yes    Birth control/protection: Post-menopausal  Other Topics Concern   Not on file  Social History Narrative   Not on file   Social Determinants of Health   Financial Resource Strain: Not on file  Food Insecurity: Not on file  Transportation Needs: Not on file  Physical Activity: Not on file  Stress: Not on file  Social Connections: Not on file  Intimate Partner Violence: Not on file    Family History  Problem Relation Age of Onset   Cancer Other    Hypertension Other    Diabetes Other    Diabetes Sister    Hypertension Sister     ROS: no fevers or chills, productive cough, hemoptysis, dysphasia, odynophagia, melena, hematochezia, dysuria, hematuria, rash, seizure activity, orthopnea, PND, pedal edema, claudication. Remaining systems are negative.  Physical Exam:   Blood pressure 128/64, pulse 74, height 5\' 6"  (1.676 m), weight 217 lb 3.2 oz (98.5 kg), SpO2 98 %.  General:  Well developed/well nourished in NAD Skin warm/dry Patient not depressed No peripheral clubbing Back-normal HEENT-normal/normal eyelids Neck supple/normal carotid upstroke bilaterally; no bruits; no JVD; no thyromegaly chest - CTA/ normal expansion CV - RRR/normal S1 and S2; no rubs or gallops;  PMI nondisplaced; 2/6 systolic murmur left sternal border.  No diastolic murmur noted. Abdomen -NT/ND, no HSM, no mass, + bowel sounds, no bruit 2+ femoral pulses, no bruits Ext-no edema, chords, 2+ DP Neuro-grossly nonfocal  ECG -normal sinus rhythm at a rate of 74, no ST changes.  Personally reviewed  A/P  1 murmur-possible flow murmur.  We will arrange echocardiogram to further assess.  2 hyperlipidemia-continue statin.  3 hypertension-blood pressure controlled.  Continue present medications.  4 diabetes mellitus-management per primary care.  Given multiple risk factors  I will arrange a calcium score for risk stratification.  Kirk Ruths, MD

## 2021-03-11 ENCOUNTER — Encounter: Payer: Self-pay | Admitting: Cardiology

## 2021-03-11 ENCOUNTER — Other Ambulatory Visit: Payer: Self-pay

## 2021-03-11 ENCOUNTER — Ambulatory Visit: Payer: Medicare PPO | Admitting: Cardiology

## 2021-03-11 VITALS — BP 128/64 | HR 74 | Ht 66.0 in | Wt 217.2 lb

## 2021-03-11 DIAGNOSIS — E7800 Pure hypercholesterolemia, unspecified: Secondary | ICD-10-CM

## 2021-03-11 DIAGNOSIS — R011 Cardiac murmur, unspecified: Secondary | ICD-10-CM

## 2021-03-11 DIAGNOSIS — Z136 Encounter for screening for cardiovascular disorders: Secondary | ICD-10-CM

## 2021-03-11 DIAGNOSIS — I350 Nonrheumatic aortic (valve) stenosis: Secondary | ICD-10-CM | POA: Diagnosis not present

## 2021-03-11 NOTE — Patient Instructions (Signed)
  Testing/Procedures:  Your physician has requested that you have an echocardiogram. Echocardiography is a painless test that uses sound waves to create images of your heart. It provides your doctor with information about the size and shape of your heart and how well your heart's chambers and valves are working. This procedure takes approximately one hour. There are no restrictions for this procedure. [place]   Follow-Up: At [facility], you and your health needs are our priority.  As part of our continuing mission to provide you with exceptional heart care, we have created designated Provider Care Teams.  These Care Teams include your primary Cardiologist (physician) and Advanced Practice Providers (APPs -  Physician Assistants and Nurse Practitioners) who all work together to provide you with the care you need, when you need it.  We recommend signing up for the patient portal called "MyChart".  Sign up information is provided on this After Visit Summary.  MyChart is used to connect with patients for Virtual Visits (Telemedicine).  Patients are able to view lab/test results, encounter notes, upcoming appointments, etc.  Non-urgent messages can be sent to your provider as well.   To learn more about what you can do with MyChart, go to [email or web address].    Your next appointment:   6 month(s)  The format for your next appointment:   In Person  Provider:   Kirk Ruths, MD

## 2021-03-21 ENCOUNTER — Other Ambulatory Visit: Payer: Self-pay

## 2021-03-21 ENCOUNTER — Ambulatory Visit
Admission: RE | Admit: 2021-03-21 | Discharge: 2021-03-21 | Disposition: A | Discharge status: Home / Self Care | Payer: Medicare PPO | Source: Ambulatory Visit | Attending: Internal Medicine | Admitting: Internal Medicine

## 2021-03-21 ENCOUNTER — Other Ambulatory Visit: Payer: Self-pay | Admitting: Internal Medicine

## 2021-03-21 DIAGNOSIS — Z1231 Encounter for screening mammogram for malignant neoplasm of breast: Secondary | ICD-10-CM | POA: Diagnosis not present

## 2021-03-21 HISTORY — DX: Malignant neoplasm of unspecified site of unspecified female breast: C50.919

## 2021-03-25 DIAGNOSIS — E7800 Pure hypercholesterolemia, unspecified: Secondary | ICD-10-CM | POA: Diagnosis not present

## 2021-03-25 DIAGNOSIS — E669 Obesity, unspecified: Secondary | ICD-10-CM | POA: Diagnosis not present

## 2021-03-25 DIAGNOSIS — E1165 Type 2 diabetes mellitus with hyperglycemia: Secondary | ICD-10-CM | POA: Diagnosis not present

## 2021-03-25 DIAGNOSIS — I10 Essential (primary) hypertension: Secondary | ICD-10-CM | POA: Diagnosis not present

## 2021-03-25 DIAGNOSIS — R42 Dizziness and giddiness: Secondary | ICD-10-CM | POA: Diagnosis not present

## 2021-04-08 ENCOUNTER — Ambulatory Visit
Admission: RE | Admit: 2021-04-08 | Discharge: 2021-04-08 | Disposition: A | Discharge status: Home / Self Care | Payer: Self-pay | Source: Ambulatory Visit | Attending: Cardiology | Admitting: Cardiology

## 2021-04-08 ENCOUNTER — Ambulatory Visit (HOSPITAL_COMMUNITY): Payer: Medicare PPO | Attending: Cardiology

## 2021-04-08 ENCOUNTER — Telehealth: Payer: Self-pay | Admitting: Cardiology

## 2021-04-08 ENCOUNTER — Other Ambulatory Visit: Payer: Self-pay

## 2021-04-08 DIAGNOSIS — Z136 Encounter for screening for cardiovascular disorders: Secondary | ICD-10-CM

## 2021-04-08 DIAGNOSIS — R011 Cardiac murmur, unspecified: Secondary | ICD-10-CM | POA: Insufficient documentation

## 2021-04-08 LAB — ECHOCARDIOGRAM COMPLETE
Area-P 1/2: 3.91 cm2
S' Lateral: 2 cm

## 2021-04-08 NOTE — Telephone Encounter (Signed)
Please make sure that Dr. Micah Noel, MD  get a copy of the echo and ct report.  Please fax [phone] [phone]

## 2021-04-13 NOTE — Telephone Encounter (Signed)
Echo and CT scan faxed to the number provided.

## 2021-04-15 DIAGNOSIS — R195 Other fecal abnormalities: Secondary | ICD-10-CM | POA: Diagnosis not present

## 2021-04-15 DIAGNOSIS — K5900 Constipation, unspecified: Secondary | ICD-10-CM | POA: Diagnosis not present

## 2021-04-15 DIAGNOSIS — Z8371 Family history of colonic polyps: Secondary | ICD-10-CM | POA: Diagnosis not present

## 2021-04-15 DIAGNOSIS — D649 Anemia, unspecified: Secondary | ICD-10-CM | POA: Diagnosis not present

## 2021-04-16 ENCOUNTER — Encounter: Payer: Self-pay | Admitting: *Deleted

## 2021-04-16 DIAGNOSIS — E559 Vitamin D deficiency, unspecified: Secondary | ICD-10-CM | POA: Diagnosis not present

## 2021-04-23 DIAGNOSIS — I10 Essential (primary) hypertension: Secondary | ICD-10-CM | POA: Diagnosis not present

## 2021-04-23 DIAGNOSIS — E7800 Pure hypercholesterolemia, unspecified: Secondary | ICD-10-CM | POA: Diagnosis not present

## 2021-04-23 DIAGNOSIS — E1165 Type 2 diabetes mellitus with hyperglycemia: Secondary | ICD-10-CM | POA: Diagnosis not present

## 2021-04-24 ENCOUNTER — Telehealth: Payer: Self-pay | Admitting: Cardiology

## 2021-04-24 NOTE — Telephone Encounter (Signed)
Patient calling for CT and echo results. She states she is off the rest of the day but normally works from 7 am to 5 pm. She says if she does not answer to please leave a detailed voicemail of the results and recommendations. She says she also did not receive the letter of results and would like that sent out again.

## 2021-04-24 NOTE — Telephone Encounter (Signed)
As requested by patient, I left a voicemail of the results of her echo and calcium scoring, and let her know to call the clinic if has has any questions/concerns.

## 2021-05-12 DIAGNOSIS — C50912 Malignant neoplasm of unspecified site of left female breast: Secondary | ICD-10-CM | POA: Diagnosis not present

## 2021-06-11 DIAGNOSIS — E7800 Pure hypercholesterolemia, unspecified: Secondary | ICD-10-CM | POA: Diagnosis not present

## 2021-06-11 DIAGNOSIS — E1165 Type 2 diabetes mellitus with hyperglycemia: Secondary | ICD-10-CM | POA: Diagnosis not present

## 2021-06-11 DIAGNOSIS — I10 Essential (primary) hypertension: Secondary | ICD-10-CM | POA: Diagnosis not present

## 2021-06-22 DIAGNOSIS — E1165 Type 2 diabetes mellitus with hyperglycemia: Secondary | ICD-10-CM | POA: Diagnosis not present

## 2021-06-30 ENCOUNTER — Encounter: Payer: Self-pay | Admitting: Dietician

## 2021-06-30 ENCOUNTER — Other Ambulatory Visit: Payer: Self-pay

## 2021-06-30 ENCOUNTER — Encounter: Payer: Medicare PPO | Attending: Endocrinology | Admitting: Dietician

## 2021-06-30 DIAGNOSIS — Z713 Dietary counseling and surveillance: Secondary | ICD-10-CM | POA: Diagnosis not present

## 2021-06-30 DIAGNOSIS — E119 Type 2 diabetes mellitus without complications: Secondary | ICD-10-CM

## 2021-06-30 DIAGNOSIS — E1165 Type 2 diabetes mellitus with hyperglycemia: Secondary | ICD-10-CM | POA: Diagnosis not present

## 2021-06-30 NOTE — Progress Notes (Signed)
Diabetes Self-Management Education  Visit Type: First/Initial  Appt. Start Time: 1515 Appt. End Time: 1625  06/30/2021  [name], identified by name and date of birth, is a 66 y.o. female with a diagnosis of Diabetes: Type 2.   ASSESSMENT Pt is taking metformin QD, no GI side effects. Pt reports occasional tingling in their feet, and cramping in their legs for the last 6 months since starting metformin. Pt goes to a podiatrist every 7-8 weeks for foot exams/pedicures. Pt states their doctor wants to initiate Jardiance but pt is putting this off due to not wanting to use the bathroom frequently, and cost as well. Pt does not want to prick their fingers anymore due to increased costs of strips and lancets, and worries about infections. Pt finds that testing their blood sugar is not beneficial for them. Pt reports they will be getting their A1c rechecked in June. Pt reports riding stationary bike, and lifting weights at the [facility]. Pt has been trying to eat little to no carbs recently.  There were no vitals taken for this visit. There is no height or weight on file to calculate BMI.   Diabetes Self-Management Education - 06/30/21 1529       Visit Information   Visit Type First/Initial      Initial Visit   Diabetes Type Type 2    Are you currently following a meal plan? No    Are you taking your medications as prescribed? Yes    Date Diagnosed 05/07/2019      Health Coping   How would you rate your overall health? Good      Psychosocial Assessment   Patient Belief/Attitude about Diabetes Motivated to manage diabetes    Self-care barriers None    Self-management support Doctor's office    Other persons present Patient    Patient Concerns Nutrition/Meal planning;Healthy Lifestyle    Special Needs None    Preferred Learning Style No preference indicated    Learning Readiness Contemplating    How often do you need to have someone help you when you read instructions,  pamphlets, or other written materials from your doctor or pharmacy? 1 - Never    What is the last grade level you completed in school? MS Degree      Pre-Education Assessment   Patient understands the diabetes disease and treatment process. Needs Instruction    Patient understands incorporating nutritional management into lifestyle. Needs Instruction    Patient undertands incorporating physical activity into lifestyle. Needs Instruction    Patient understands using medications safely. Needs Instruction    Patient understands monitoring blood glucose, interpreting and using results Needs Instruction    Patient understands prevention, detection, and treatment of acute complications. Needs Instruction    Patient understands prevention, detection, and treatment of chronic complications. Needs Instruction    Patient understands how to develop strategies to address psychosocial issues. Needs Instruction    Patient understands how to develop strategies to promote health/change behavior. Needs Instruction      Complications   Last HgB A1C per patient/outside source 7 %   06/22/2021   How often do you check your blood sugar? Patient declines   Resisitance to checking   Have you had a dilated eye exam in the past 12 months? No    Have you had a dental exam in the past 12 months? No    Are you checking your feet? Yes    How many days per week are you checking your  feet? 3   Neuropath, goes to podiatrist     Dietary Intake   Breakfast Protein shake (low sugar), water    Lunch 25 grapes, water    Dinner 1/2 Rueben sandwich, potato salad, diet ginger ale, apple cider    Snack (evening) Ginger snap cookies    Beverage(s) Diet ginger ale, water, 4 oz apple cider      Exercise   Exercise Type ADL's;Moderate (swimming / aerobic walking)    How many days per week to you exercise? 2    How many minutes per day do you exercise? 30    Total minutes per week of exercise 60      Patient Education    Previous Diabetes Education No    Disease state  Explored patient's options for treatment of their diabetes    Nutrition management  Carbohydrate counting;Food label reading, portion sizes and measuring food.;Role of diet in the treatment of diabetes and the relationship between the three main macronutrients and blood glucose level    Physical activity and exercise  Role of exercise on diabetes management, blood pressure control and cardiac health.    Medications Reviewed patients medication for diabetes, action, purpose, timing of dose and side effects.    Monitoring Purpose and frequency of SMBG.;Daily foot exams    Chronic complications Assessed and discussed foot care and prevention of foot problems;Relationship between chronic complications and blood glucose control    Psychosocial adjustment Worked with patient to identify barriers to care and solutions;Role of stress on diabetes    Personal strategies to promote health Helped patient develop diabetes management plan for (enter comment)   Carb recognition     Individualized Goals (developed by patient)   Nutrition Follow meal plan discussed    Physical Activity Exercise 1-2 times per week    Medications take my medication as prescribed    Monitoring  Not Applicable      Post-Education Assessment   Patient understands the diabetes disease and treatment process. Needs Review    Patient understands incorporating nutritional management into lifestyle. Needs Review    Patient undertands incorporating physical activity into lifestyle. Needs Review    Patient understands using medications safely. Needs Review    Patient understands monitoring blood glucose, interpreting and using results Needs Review    Patient understands prevention, detection, and treatment of acute complications. Needs Review    Patient understands prevention, detection, and treatment of chronic complications. Needs Review    Patient understands how to develop strategies to  address psychosocial issues. Needs Review    Patient understands how to develop strategies to promote health/change behavior. Needs Review      Outcomes   Expected Outcomes Demonstrated interest in learning. Expect positive outcomes    Future DMSE 4-6 wks    Program Status Not Completed             Individualized Plan for Diabetes Self-Management Training:   Learning Objective:  Patient will have a greater understanding of diabetes self-management. Patient education plan is to attend individual and/or group sessions per assessed needs and concerns.   Plan:   Patient Instructions  Choose sugar free Jello for a sweet treat.  Choose diet, or Zero Sugar sodas.  Look into Smart Balance or any other plant-based butter. Look for low saturated fat.  When having fruit, make sure your serving size is about the size of your fist or a tennis ball, and only have half a banana.  Work towards eating three meals  a day, about 5-6 hours apart!  Begin to recognize carbohydrates, proteins, and non-starchy vegetables in your food choices!  Have 2-3 carb choices at each meal (30-45 g).   Begin to build your meals using the proportions of the Balanced Plate. First, select your carb choice(s) for the meal, and determine how much you should have to equal 2-3 carb choices (30-45 g).Make this 25% of your meal. Next, select your source of protein to pair with your carb choice(s). Make this another 25% of your meal. Finally, complete your meal with a variety of non-starchy vegetables. Make this the remaining 50% of your meal. ALL vegetables that are not corn, green peas, or potatoes   Expected Outcomes:  Demonstrated interest in learning. Expect positive outcomes  Education material provided: Food label handouts, My Plate, and Carbohydrate counting sheet  If problems or questions, patient to contact team via:  Phone and Email  Future DSME appointment: 4-6 wks

## 2021-06-30 NOTE — Patient Instructions (Addendum)
Choose sugar free Jello for a sweet treat.  Choose diet, or Zero Sugar sodas.  Look into Smart Balance or any other plant-based butter. Look for low saturated fat.  When having fruit, make sure your serving size is about the size of your fist or a tennis ball, and only have half a banana.  Work towards eating three meals a day, about 5-6 hours apart!  Begin to recognize carbohydrates, proteins, and non-starchy vegetables in your food choices!  Have 2-3 carb choices at each meal (30-45 g).   Begin to build your meals using the proportions of the Balanced Plate. First, select your carb choice(s) for the meal, and determine how much you should have to equal 2-3 carb choices (30-45 g).Make this 25% of your meal. Next, select your source of protein to pair with your carb choice(s). Make this another 25% of your meal. Finally, complete your meal with a variety of non-starchy vegetables. Make this the remaining 50% of your meal. ALL vegetables that are not corn, green peas, or potatoes

## 2021-07-10 DIAGNOSIS — N952 Postmenopausal atrophic vaginitis: Secondary | ICD-10-CM | POA: Diagnosis not present

## 2021-07-10 DIAGNOSIS — N3281 Overactive bladder: Secondary | ICD-10-CM | POA: Diagnosis not present

## 2021-07-10 DIAGNOSIS — N3941 Urge incontinence: Secondary | ICD-10-CM | POA: Diagnosis not present

## 2021-07-28 ENCOUNTER — Ambulatory Visit: Payer: Medicare PPO | Admitting: Dietician

## 2021-08-11 ENCOUNTER — Ambulatory Visit: Payer: Medicare PPO | Admitting: Podiatry

## 2021-08-14 ENCOUNTER — Ambulatory Visit: Payer: Medicare PPO | Admitting: Podiatry

## 2021-08-14 DIAGNOSIS — M79675 Pain in left toe(s): Secondary | ICD-10-CM

## 2021-08-14 DIAGNOSIS — B351 Tinea unguium: Secondary | ICD-10-CM | POA: Diagnosis not present

## 2021-08-14 DIAGNOSIS — M79674 Pain in right toe(s): Secondary | ICD-10-CM | POA: Diagnosis not present

## 2021-08-14 DIAGNOSIS — E119 Type 2 diabetes mellitus without complications: Secondary | ICD-10-CM

## 2021-08-14 MED ORDER — AMMONIUM LACTATE 12 % EX CREA: TOPICAL_CREAM | CUTANEOUS | 2 refills | Status: DC | PRN

## 2021-08-14 NOTE — Progress Notes (Signed)
? ?  SUBJECTIVE ?Patient with a history of diabetes mellitus presents to office today complaining of elongated, thickened nails that cause pain while ambulating in shoes.  Patient is unable to trim their own nails. Patient is here for further evaluation and treatment. ? ? ?Past Medical History:  ?Diagnosis Date  ? Abnormal Pap smear 05/25/1991  ? Anemia   ? Bladder infection   ? Breast cancer ([place])   ? Breast cancer, left ([place])   ? survivor; remission since 2008  ? Depression 07/16/1991  ? postpartum depression; non currently;   ? Diabetes mellitus without complication ([place])   ? Fibroids   ? h/o  ? H/O hyperlipidemia   ? H/O menorrhagia   ? Headache(784.0)   ? Hypertension   ? Oligomenorrhea   ? h/o  ? Ovarian cyst   ? h/o asymptomatic  ? Pain, female pelvic   ? h/o  ? Varicose veins   ? Mother  ? ? ?OBJECTIVE ?General Patient is awake, alert, and oriented x 3 and in no acute distress. ?Derm Skin is dry and supple bilateral. Negative open lesions or macerations. Remaining integument unremarkable. Nails are tender, long, thickened and dystrophic with subungual debris, consistent with onychomycosis, 1-5 bilateral. No signs of infection noted. ?Vasc  DP and PT pedal pulses palpable bilaterally. Temperature gradient within normal limits.  ?Neuro Epicritic and protective threshold sensation diminished bilaterally.  ?Musculoskeletal Exam No symptomatic pedal deformities noted bilateral. Muscular strength within normal limits. ? ?ASSESSMENT ?1. Diabetes Mellitus w/ peripheral neuropathy ?2.  Pain due to onychomycosis of toenails bilateral ? ?PLAN OF CARE ?1. Patient evaluated today. ?2. Instructed to maintain good pedal hygiene and foot care. Stressed importance of controlling blood sugar.  ?3. Mechanical debridement of nails 1-5 bilaterally performed using a nail nipper. Filed with dremel without incident.  ?4.  Refill prescription for Lac-Hydrin 12% lotion ?5.  Return to clinic in 3 mos.  ? ? ? ?Edrick Kins, DPM ?[facility] ? ?Dr. Edrick Kins, DPM  ?  ?[address]                                      ?[place], [place] [address]                ?Office [phone]  ?Fax [phone] ? ? ? ? ? ?

## 2021-09-01 ENCOUNTER — Encounter: Payer: Self-pay | Admitting: Dietician

## 2021-09-01 ENCOUNTER — Encounter: Payer: Medicare PPO | Attending: Endocrinology | Admitting: Dietician

## 2021-09-01 DIAGNOSIS — E119 Type 2 diabetes mellitus without complications: Secondary | ICD-10-CM | POA: Diagnosis not present

## 2021-09-01 DIAGNOSIS — Z7984 Long term (current) use of oral hypoglycemic drugs: Secondary | ICD-10-CM | POA: Insufficient documentation

## 2021-09-01 DIAGNOSIS — Z713 Dietary counseling and surveillance: Secondary | ICD-10-CM | POA: Insufficient documentation

## 2021-09-01 NOTE — Patient Instructions (Addendum)
When reading nutrition labels, the first thing you want to read is SERVING SIZE. When comparing two items, always make sure you find the common serving size between the two and then multiply the nutrition values below to make your comparison. ? ?When eggs, only have 1 egg yolk a day or choose egg whites. ? ?Work towards eating three meals a day, about 5-6 hours apart! ? ?Have 2-3 carb choices at each meal (30-45 g).  ? ?Use your worksheet on Hand Gestures for portions sizes to help judge portion sizes. ? ?Keep your nutrition label handout with you when shopping to help make appropriate food choices. ? ? ?

## 2021-09-01 NOTE — Progress Notes (Signed)
Diabetes Self-Management Education ? ?Visit Type: Follow-up ? ?Appt. Start Time: 1545 Appt. End Time: 1620 ? ?09/01/2021 ? ?[name], identified by name and date of birth, is a 66 y.o. female with a diagnosis of Diabetes:  .  ? ?ASSESSMENT ?Pt reports their brother has passed very recently, and their mother is in poor health as well. Pt reports that there stress is slightly elevated due to this.  ?Pt is interested in reading nutrition labels.  ?RD spent entire visit educating pt on how to read nutrition labels. ?There were no vitals taken for this visit. ?There is no height or weight on file to calculate BMI. ? ? Diabetes Self-Management Education - 09/01/21 1818   ? ?  ? Visit Information  ? Visit Type Follow-up   ?  ? Individualized Goals (developed by patient)  ? Nutrition Follow meal plan discussed;General guidelines for healthy choices and portions discussed   ?  ? Patient Self-Evaluation of Goals - Patient rates self as meeting previously set goals (% of time)  ? Nutrition < 25%   ? Physical Activity < 25%   ? Medications >75%   ? Monitoring < 25%   ? Problem Solving < 25%   ? Reducing Risk < 25%   ? Health Coping < 25%   ?  ? Post-Education Assessment  ? Patient understands the diabetes disease and treatment process. Needs Review   ? Patient understands incorporating nutritional management into lifestyle. Needs Review   ? Patient undertands incorporating physical activity into lifestyle. Needs Review   ? Patient understands using medications safely. Needs Review   ? Patient understands monitoring blood glucose, interpreting and using results Needs Review   ? Patient understands prevention, detection, and treatment of acute complications. Needs Review   ? Patient understands prevention, detection, and treatment of chronic complications. Needs Review   ? Patient understands how to develop strategies to address psychosocial issues. Needs Review   ? Patient understands how to develop strategies to  promote health/change behavior. Needs Review   ?  ? Outcomes  ? Expected Outcomes Demonstrated limited interest in learning.  Expect minimal changes   ? Program Status Not Completed   ?  ? Subsequent Visit  ? Since your last visit have you continued or begun to take your medications as prescribed? Yes   ? Since your last visit have you had your blood pressure checked? No   ? Since your last visit have you experienced any weight changes? No change   ? Since your last visit, are you checking your blood glucose at least once a day? No   ? ?  ?  ? ?  ? ? ?Individualized Plan for Diabetes Self-Management Training:  ? ?Learning Objective:  Patient will have a greater understanding of diabetes self-management. ?Patient education plan is to attend individual and/or group sessions per assessed needs and concerns. ?  ?Plan:  ? ?Patient Instructions  ?When reading nutrition labels, the first thing you want to read is SERVING SIZE. When comparing two items, always make sure you find the common serving size between the two and then multiply the nutrition values below to make your comparison. ? ?When eggs, only have 1 egg yolk a day or choose egg whites. ? ?Work towards eating three meals a day, about 5-6 hours apart! ? ?Have 2-3 carb choices at each meal (30-45 g).  ? ?Use your worksheet on Hand Gestures for portions sizes to help judge portion sizes. ? ?Keep your nutrition  label handout with you when shopping to help make appropriate food choices. ? ? ? ?Expected Outcomes:  Demonstrated limited interest in learning.  Expect minimal changes ? ?Future DSME appointment:  PRN ?

## 2021-09-29 ENCOUNTER — Ambulatory Visit: Payer: Medicare PPO | Admitting: Dietician

## 2021-10-02 ENCOUNTER — Encounter (HOSPITAL_COMMUNITY): Payer: Self-pay | Admitting: Emergency Medicine

## 2021-10-02 ENCOUNTER — Ambulatory Visit (HOSPITAL_COMMUNITY)
Admission: EM | Admit: 2021-10-02 | Discharge: 2021-10-02 | Disposition: A | Discharge status: Home / Self Care | Payer: Medicare PPO | Attending: Student | Admitting: Student

## 2021-10-02 DIAGNOSIS — L239 Allergic contact dermatitis, unspecified cause: Secondary | ICD-10-CM

## 2021-10-02 MED ORDER — HYDROCORTISONE 2.5 % EX LOTN: TOPICAL_LOTION | Freq: Two times a day (BID) | CUTANEOUS | 0 refills | Status: AC

## 2021-10-02 NOTE — Discharge Instructions (Addendum)
-  Hydrocortisone lotion twice daily x7 days  -Benedryl (diphenhydramine) 25-'50mg'$  (1-2 pills) as needed for itching, up to every 6 hours.  This medication will cause drowsiness.

## 2021-10-02 NOTE — ED Triage Notes (Signed)
Pt reports rash on right thigh, right upper arm and chest area since Monday. States she did recently change laundry detergents. Denies any other exposure.

## 2021-10-02 NOTE — ED Provider Notes (Signed)
[facility]    CSN: [id number] Arrival date & time: 10/02/21  1543      History   Chief Complaint Chief Complaint  Patient presents with   Rash    HPI [name] is a 66 y.o. female presenting with itchy rash x5 days. History noncontributory.  Describes itchy rash that started on her arms, now with a patch on the right upper forearm.  She has attempted Benadryl once with minimal relief.  The rash did not spread to the face.  It is not burning or painful.  Denies facial involvement.  Denies shortness of breath. States used new laundry detergent.  HPI  Past Medical History:  Diagnosis Date   Abnormal Pap smear 05/25/1991   Anemia    Bladder infection    Breast cancer ([place])    Breast cancer, left ([place])    survivor; remission since 2008   Depression 07/16/1991   postpartum depression; non currently;    Diabetes mellitus without complication ([place])    Fibroids    h/o   H/O hyperlipidemia    H/O menorrhagia    Headache(784.0)    Hypertension    Oligomenorrhea    h/o   Ovarian cyst    h/o asymptomatic   Pain, female pelvic    h/o   Varicose veins    Mother    Patient Active Problem List   Diagnosis Date Noted   Heel callus 03/24/2020   Pain due to onychomycosis of toenails of both feet 05/07/2019   Diabetes mellitus without complication ([place]) 48/18/5631   Malignant neoplasm of overlapping sites of left breast in female, estrogen receptor positive ([place]) 08/12/2016   Ovarian cyst    H/O hyperlipidemia    Pain, female pelvic    Fibroids    Pelvic pain 09/27/2011   S/P endometrial ablation 09/27/2011   Chronic PID (chronic pelvic inflammatory disease) 09/23/2011   Pelvic adhesions 09/23/2011   Spasm of colon 09/23/2011    Past Surgical History:  Procedure Laterality Date   BREAST SURGERY     COLPOSCOPY  06/1991   DIAGNOSTIC LAPAROSCOPY  1994   HYSTEROSCOPY WITH D & C  07/20/2007   lysis of adhesions  Resolved 10/26/90   For pelvic adhesions    MASTECTOMY  2008   left breast   TUBAL LIGATION  1997    OB History     Gravida  2   Para  2   Term  2   Preterm  0   AB  0   Living  2      SAB  0   IAB  0   Ectopic  0   Multiple  0   Live Births               Home Medications    Prior to Admission medications   Medication Sig Start Date End Date Taking? Authorizing Provider  hydrocortisone 2.5 % lotion Apply topically 2 (two) times daily for 7 days. 10/02/21 10/09/21 Yes Hazel Sams, PA-C  albuterol (VENTOLIN HFA) 108 (90 Base) MCG/ACT inhaler Inhale 1-2 puffs into the lungs every 6 (six) hours as needed for wheezing or shortness of breath. 08/28/20   Hazel Sams, PA-C  ammonium lactate (LAC-HYDRIN) 12 % cream Apply topically as needed for dry skin. 08/14/21   Edrick Kins, DPM  atorvastatin (LIPITOR) 10 MG tablet Take 10 mg by mouth daily.    [provider]  clotrimazole (LOTRIMIN) 1 % cream Apply  to affected area 2 times daily 10/04/19   Melynda Ripple, MD  fesoterodine (TOVIAZ) 8 MG TB24 tablet Take by mouth. 01/14/21   [provider]  furosemide (LASIX) 20 MG tablet furosemide 20 mg tablet    [provider]  losartan (COZAAR) 25 MG tablet 1 tablet    [provider]  metFORMIN (GLUCOPHAGE-XR) 750 MG 24 hr tablet 1 tablet 12/12/19   [provider]  metroNIDAZOLE (METROGEL) 1 % gel Apply topically daily. 02/24/16   Shawnee Knapp, MD  Multiple Vitamin (MULTI-VITAMIN) tablet Take 1 tablet by mouth daily.    [provider]  Omega-3 Fatty Acids (OMEGA-3 FISH OIL PO) Omega 3 Fish Oil    [provider]  promethazine-dextromethorphan (PROMETHAZINE-DM) 6.25-15 MG/5ML syrup Take 5 mLs by mouth 4 (four) times daily as needed for cough. 08/28/20   Hazel Sams, PA-C  tolterodine (DETROL LA) 4 MG 24 hr capsule 1 capsule 03/03/20   [provider]  VITAMIN D PO 5,000 unit capsules    [provider]    Family  History Family History  Problem Relation Age of Onset   Diabetes Sister    Hypertension Sister    Breast cancer Maternal Aunt    Breast cancer Maternal Aunt    Cancer Other    Hypertension Other    Diabetes Other     Social History Social History   Tobacco Use   Smoking status: Former    Types: Cigarettes    Quit date: 06/08/1981    Years since quitting: 40.3   Smokeless tobacco: Never  Vaping Use   Vaping Use: Never used  Substance Use Topics   Alcohol use: Not Currently    Comment: Rare   Drug use: No     Allergies   Other, Multihance [gadobenate dimeglumine], Amoxicillin, Amoxicillin [amoxicillin], Gluten meal, Iodinated contrast media, and Kiwi extract   Review of Systems Review of Systems  Skin:  Positive for rash.  All other systems reviewed and are negative.   Physical Exam Triage Vital Signs ED Triage Vitals  Enc Vitals Group     BP 10/02/21 1619 134/74     Pulse Rate 10/02/21 1619 80     Resp 10/02/21 1619 16     Temp 10/02/21 1619 98.4 F (36.9 C)     Temp Source 10/02/21 1619 Oral     SpO2 10/02/21 1619 95 %     Weight 10/02/21 1618 217 lb 2.5 oz (98.5 kg)     Height 10/02/21 1618 '5\' 6"'$  (1.676 m)     Head Circumference --      Peak Flow --      Pain Score 10/02/21 1618 0     Pain Loc --      Pain Edu? --      Excl. in [place]? --    No data found.  Updated Vital Signs BP 134/74 (BP Location: Right Arm)   Pulse 80   Temp 98.4 F (36.9 C) (Oral)   Resp 16   Ht '5\' 6"'$  (1.676 m)   Wt 217 lb 2.5 oz (98.5 kg)   SpO2 95%   BMI 35.05 kg/m   Visual Acuity Right Eye Distance:   Left Eye Distance:   Bilateral Distance:    Right Eye Near:   Left Eye Near:    Bilateral Near:     Physical Exam Vitals reviewed.  Constitutional:      General: She is not in acute distress.  Appearance: Normal appearance. She is not ill-appearing.  HENT:     Head: Normocephalic and atraumatic.  Pulmonary:     Effort: Pulmonary effort is normal.  Skin:     Comments: See image below Few small patches of urticarial rash R upper arm. No facial, lip, tongue involvement.   Neurological:     General: No focal deficit present.     Mental Status: She is alert and oriented to person, place, and time.  Psychiatric:        Mood and Affect: Mood normal.        Behavior: Behavior normal.        Thought Content: Thought content normal.        Judgment: Judgment normal.       UC Treatments / Results  Labs (all labs ordered are listed, but only abnormal results are displayed) Labs Reviewed - No data to display  EKG   Radiology No results found.  Procedures Procedures (including critical care time)  Medications Ordered in UC Medications - No data to display  Initial Impression / Assessment and Plan / UC Course  I have reviewed the triage vital signs and the nursing notes.  Pertinent labs & imaging results that were available during my care of the patient were reviewed by me and considered in my medical decision making (see chart for details).     This patient is a very pleasant 66 y.o. year old female presenting with contact dermatitis. Symptoms are mild in nature. Hydrocortisone cream sent. Rec benedryl OTC. ED return precautions discussed. Patient verbalizes understanding and agreement.  .   Final Clinical Impressions(s) / UC Diagnoses   Final diagnoses:  Allergic contact dermatitis, unspecified trigger     Discharge Instructions      -Hydrocortisone lotion twice daily x7 days  -Benedryl (diphenhydramine) 25-'50mg'$  (1-2 pills) as needed for itching, up to every 6 hours.  This medication will cause drowsiness.      ED Prescriptions     Medication Sig Dispense Auth. Provider   hydrocortisone 2.5 % lotion Apply topically 2 (two) times daily for 7 days. 59 mL Hazel Sams, PA-C      PDMP not reviewed this encounter.   Hazel Sams, PA-C 10/02/21 1652

## 2021-10-07 DIAGNOSIS — L239 Allergic contact dermatitis, unspecified cause: Secondary | ICD-10-CM | POA: Diagnosis not present

## 2021-10-27 DIAGNOSIS — I10 Essential (primary) hypertension: Secondary | ICD-10-CM | POA: Diagnosis not present

## 2021-10-27 DIAGNOSIS — R5383 Other fatigue: Secondary | ICD-10-CM | POA: Diagnosis not present

## 2021-10-27 DIAGNOSIS — E7800 Pure hypercholesterolemia, unspecified: Secondary | ICD-10-CM | POA: Diagnosis not present

## 2021-10-27 DIAGNOSIS — E1165 Type 2 diabetes mellitus with hyperglycemia: Secondary | ICD-10-CM | POA: Diagnosis not present

## 2021-10-28 ENCOUNTER — Encounter: Payer: Self-pay | Admitting: Skilled Nursing Facility1

## 2021-10-28 ENCOUNTER — Encounter: Payer: Medicare PPO | Attending: Internal Medicine | Admitting: Skilled Nursing Facility1

## 2021-10-28 DIAGNOSIS — E119 Type 2 diabetes mellitus without complications: Secondary | ICD-10-CM | POA: Insufficient documentation

## 2021-10-28 NOTE — Progress Notes (Signed)
Pt states her goal is to be under 6.5 A1C because she wants to get off all of her DM medications. Pt states she is trying to eat differently. Pt wonders if her supplements or medications add sugar to her diet. Pt states she would like to be a wiser food consumer and learn the food label. [facility] and wants to increase that. Pt states she tries to save money but then has to use her saving because she does not have a job for 2 months out of the year. 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 Pt state she was told if she could work 3 more years she could get more retirement money.  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  Pt states she got her weight down from 240 pounds.   Pt states she exercises 2 days a week at the gym.   Educated pt on the flow of blood glucose and the metabolism of glucose.   Pt states she needs to come every month to work with the dietitian because she needs to get off all of her DM medications (taking metformin) and needs to control her out of control A1C (A1C states is 7.0).   Education Topics: Nutrition Facts Label Reading Complex verses simple carbohydrates  Meal ideas specifically given Specific serving sizes of each food group  Handouts: Meal ideas sheet    Goals: -unsweet tea instead of half and half -one serving of fruit in one sitting -limit to 1/4 cup peanuts  -eat non starchy vegetables 2 times a day 7 days a week  -avoid getting chinese limiting to once a month -choose complex carbohydrates

## 2021-12-05 ENCOUNTER — Ambulatory Visit (HOSPITAL_COMMUNITY)
Admission: EM | Admit: 2021-12-05 | Discharge: 2021-12-05 | Disposition: A | Discharge status: Home / Self Care | Payer: Medicare PPO | Attending: Emergency Medicine | Admitting: Emergency Medicine

## 2021-12-05 ENCOUNTER — Encounter (HOSPITAL_COMMUNITY): Payer: Self-pay | Admitting: Emergency Medicine

## 2021-12-05 DIAGNOSIS — M25561 Pain in right knee: Secondary | ICD-10-CM | POA: Diagnosis not present

## 2021-12-05 DIAGNOSIS — M25562 Pain in left knee: Secondary | ICD-10-CM | POA: Diagnosis not present

## 2021-12-05 DIAGNOSIS — G8929 Other chronic pain: Secondary | ICD-10-CM | POA: Diagnosis not present

## 2021-12-05 DIAGNOSIS — S81811A Laceration without foreign body, right lower leg, initial encounter: Secondary | ICD-10-CM

## 2021-12-05 MED ORDER — DICLOFENAC SODIUM 1 % EX GEL: 2.0000 g | Freq: Four times a day (QID) | CUTANEOUS | 0 refills | Status: DC

## 2021-12-05 MED ORDER — LIDOCAINE 5 % EX OINT: 1.0000 | TOPICAL_OINTMENT | Freq: Four times a day (QID) | CUTANEOUS | 0 refills | Status: DC | PRN

## 2021-12-05 NOTE — Discharge Instructions (Signed)
For your leg -area does not appear to be infected and has healed properly -you do not need to continue cleansing as skin has closed - you do not need a tetanus shot as kitchen knives are considered to be clean -you may follow up for reevaluations as needed  For your knees -your pain is a result of worn torn cartilage and may become flared due to normal use throughout the day -You may apply diclofenac cream every 6 hours which has properties in it to reduce inflammation which in turn will help with your pain. - You may use lidocaine cream every 6 hours as needed for additional comfort this will provide a temporary numbing effect and will help to lessen your pain - You may alternate medications or used together whichever provides you the most comfort - You may apply ice or heat over the affected area in 10 to 15-minute intervals - You may elevate your legs on pillows while sitting and lying to help to reduce any swelling - You may massage the areas as tolerated and stretch as tolerated - You may follow-up with urgent care or your primary doctor for reevaluation as needed

## 2021-12-05 NOTE — ED Provider Notes (Signed)
Current [facility]    CSN: [id number] Arrival date & time: 12/05/21  1506      History   Chief Complaint Chief Complaint  Patient presents with   Laceration    HPI [name] is a 66 y.o. female.   Patient presents with cut to the right lower extremity beginning 2 days ago.  Endorses that she was cutting foods when the knife slipped causing a slice to her right lower extremity.  Endorses that she initially cleanse with peroxide and alcohol and covered with Band-Aid.  Concern for infection and wants to ensure that it is healing properly.  Denies fever, drainage or chills to the site.   Concerned with pain to the bilateral knees that has been occurring intermittently for years.  Endorses that she was told by physician that the cartilage in her knees has been worn down and it was recommended that she have bilateral knee replacements which she declined.  Has been using an over-the-counter medicine called Flexeril which has been effective but she has ran out of pills endorses that she has been cleaning today in her right knee pain has been flared greater than her left.  Pain is felt when walking and a stiffness occurs after sitting for long periods of time.  Denies numbness or tingling to the sites And range of motion is intact.    Past Medical History:  Diagnosis Date   Abnormal Pap smear 05/25/1991   Anemia    Bladder infection    Breast cancer ([place])    Breast cancer, left ([place])    survivor; remission since 2008   Depression 07/16/1991   postpartum depression; non currently;    Diabetes mellitus without complication ([place])    Fibroids    h/o   H/O hyperlipidemia    H/O menorrhagia    Headache(784.0)    Hypertension    Oligomenorrhea    h/o   Ovarian cyst    h/o asymptomatic   Pain, female pelvic    h/o   Varicose veins    Mother    Patient Active Problem List   Diagnosis Date Noted   Heel callus 03/24/2020   Pain due to onychomycosis of toenails of  both feet 05/07/2019   Diabetes mellitus without complication ([place]) 95/01/3266   Malignant neoplasm of overlapping sites of left breast in female, estrogen receptor positive ([place]) 08/12/2016   Ovarian cyst    H/O hyperlipidemia    Pain, female pelvic    Fibroids    Pelvic pain 09/27/2011   S/P endometrial ablation 09/27/2011   Chronic PID (chronic pelvic inflammatory disease) 09/23/2011   Pelvic adhesions 09/23/2011   Spasm of colon 09/23/2011    Past Surgical History:  Procedure Laterality Date   BREAST SURGERY     COLPOSCOPY  06/1991   DIAGNOSTIC LAPAROSCOPY  1994   HYSTEROSCOPY WITH D & C  07/20/2007   lysis of adhesions  Resolved 10/26/90   For pelvic adhesions   MASTECTOMY  2008   left breast   TUBAL LIGATION  1997    OB History     Gravida  2   Para  2   Term  2   Preterm  0   AB  0   Living  2      SAB  0   IAB  0   Ectopic  0   Multiple  0   Live Births  Home Medications    Prior to Admission medications   Medication Sig Start Date End Date Taking? Authorizing Provider  albuterol (VENTOLIN HFA) 108 (90 Base) MCG/ACT inhaler Inhale 1-2 puffs into the lungs every 6 (six) hours as needed for wheezing or shortness of breath. 08/28/20   Hazel Sams, PA-C  ammonium lactate (LAC-HYDRIN) 12 % cream Apply topically as needed for dry skin. 08/14/21   Edrick Kins, DPM  atorvastatin (LIPITOR) 10 MG tablet Take 10 mg by mouth daily.    [provider]  clotrimazole (LOTRIMIN) 1 % cream Apply to affected area 2 times daily 10/04/19   Melynda Ripple, MD  fesoterodine (TOVIAZ) 8 MG TB24 tablet Take by mouth. 01/14/21   [provider]  furosemide (LASIX) 20 MG tablet furosemide 20 mg tablet    [provider]  losartan (COZAAR) 25 MG tablet 1 tablet    [provider]  metFORMIN (GLUCOPHAGE-XR) 750 MG 24 hr tablet 1 tablet 12/12/19   [provider]  metroNIDAZOLE (METROGEL) 1 % gel Apply  topically daily. 02/24/16   Shawnee Knapp, MD  Multiple Vitamin (MULTI-VITAMIN) tablet Take 1 tablet by mouth daily.    [provider]  Omega-3 Fatty Acids (OMEGA-3 FISH OIL PO) Omega 3 Fish Oil    [provider]  promethazine-dextromethorphan (PROMETHAZINE-DM) 6.25-15 MG/5ML syrup Take 5 mLs by mouth 4 (four) times daily as needed for cough. 08/28/20   Hazel Sams, PA-C  tolterodine (DETROL LA) 4 MG 24 hr capsule 1 capsule 03/03/20   [provider]  VITAMIN D PO 5,000 unit capsules    [provider]    Family History Family History  Problem Relation Age of Onset   Diabetes Sister    Hypertension Sister    Breast cancer Maternal Aunt    Breast cancer Maternal Aunt    Cancer Other    Hypertension Other    Diabetes Other     Social History Social History   Tobacco Use   Smoking status: Former    Types: Cigarettes    Quit date: 06/08/1981    Years since quitting: 40.5   Smokeless tobacco: Never  Vaping Use   Vaping Use: Never used  Substance Use Topics   Alcohol use: Not Currently    Comment: Rare   Drug use: No     Allergies   Other, Multihance [gadobenate dimeglumine], Amoxicillin, Amoxicillin [amoxicillin], Gluten meal, Iodinated contrast media, and Kiwi extract   Review of Systems Review of Systems  Constitutional: Negative.   HENT: Negative.    Respiratory: Negative.    Cardiovascular: Negative.   Musculoskeletal:  Positive for myalgias. Negative for arthralgias, back pain, gait problem, joint swelling, neck pain and neck stiffness.  Skin:  Positive for wound. Negative for color change, pallor and rash.  Neurological: Negative.      Physical Exam Triage Vital Signs ED Triage Vitals [12/05/21 1527]  Enc Vitals Group     BP (!) 159/76     Pulse Rate 76     Resp 16     Temp 97.9 F (36.6 C)     Temp Source Oral     SpO2 93 %     Weight      Height      Head Circumference      Peak Flow      Pain Score 0      Pain Loc      Pain Edu?  Excl. in [place]?    No data found.  Updated Vital Signs BP (!) 159/76 (BP Location: Right Arm)   Pulse 76   Temp 97.9 F (36.6 C) (Oral)   Resp 16   SpO2 93%   Visual Acuity Right Eye Distance:   Left Eye Distance:   Bilateral Distance:    Right Eye Near:   Left Eye Near:    Bilateral Near:     Physical Exam Constitutional:      Appearance: Normal appearance.  HENT:     Head: Normocephalic.  Eyes:     Extraocular Movements: Extraocular movements intact.  Pulmonary:     Effort: Pulmonary effort is normal.  Musculoskeletal:     Comments: Mild tenderness is present over the right knee with mild swelling over the joint space, no effusion, ecchymosis, deformity noted, 2+ popliteal pulse, range of motion intact and is able to bear weight  No tenderness, ecchymosis, deformity noted to the left knee, mild swelling noted over the joint space without effusion, 2+ popliteal pulse, range of motion intact and is able to bear weight  Skin:    Comments: Centimeter vertical laceration present to the anterior of the right lower extremity, nontender, nondraining, no erythema noted, site has scabbed over and skin is intact  Neurological:     Mental Status: She is alert and oriented to person, place, and time.  Psychiatric:        Mood and Affect: Mood normal.        Behavior: Behavior normal.      UC Treatments / Results  Labs (all labs ordered are listed, but only abnormal results are displayed) Labs Reviewed - No data to display  EKG   Radiology No results found.  Procedures Procedures (including critical care time)  Medications Ordered in UC Medications - No data to display  Initial Impression / Assessment and Plan / UC Course  I have reviewed the triage vital signs and the nursing notes.  Pertinent labs & imaging results that were available during my care of the patient were reviewed by me and considered in my medical decision making (see  chart for details).  Laceration of the right lower leg, initial encounter Bilateral chronic knee pain  No signs of infection noted to the knee and as site is already closed patient may discontinue any wound care that she has been doing, kitchen knife is the offending agent which is considered clean and therefore she will not need a tetanus injection today, discussed all above findings with patient  Knee pain is chronic and is most likely related to decreased cartilage, prescribed diclofenac and lidocaine for outpatient management, declined Toradol injection in office and patient may continue over-the-counter analgesics which have been deemed effective by her, may also use ice or heat over the affected areas, compression and elevation for additional support, may follow-up with urgent care or primary doctor for further evaluation as needed Final Clinical Impressions(s) / UC Diagnoses   Final diagnoses:  None   Discharge Instructions   None    ED Prescriptions   None    PDMP not reviewed this encounter.   Hans [place], [place] 12/05/21 1615

## 2021-12-05 NOTE — ED Triage Notes (Signed)
Knife fell from counter two days ago and cut her right leg. Appears to be a small, superficial, scabbed over, linear scratch to right lower leg. She's concerned for infection, no redness, warmth, or drainage. Also concerned for her chronic knee pain and is asking for something to help with that.

## 2021-12-16 DIAGNOSIS — Z1231 Encounter for screening mammogram for malignant neoplasm of breast: Secondary | ICD-10-CM | POA: Diagnosis not present

## 2021-12-16 DIAGNOSIS — C50919 Malignant neoplasm of unspecified site of unspecified female breast: Secondary | ICD-10-CM | POA: Diagnosis not present

## 2021-12-16 DIAGNOSIS — M8580 Other specified disorders of bone density and structure, unspecified site: Secondary | ICD-10-CM | POA: Diagnosis not present

## 2021-12-16 DIAGNOSIS — Z1211 Encounter for screening for malignant neoplasm of colon: Secondary | ICD-10-CM | POA: Diagnosis not present

## 2021-12-16 DIAGNOSIS — N3946 Mixed incontinence: Secondary | ICD-10-CM | POA: Diagnosis not present

## 2021-12-16 DIAGNOSIS — Z01419 Encounter for gynecological examination (general) (routine) without abnormal findings: Secondary | ICD-10-CM | POA: Diagnosis not present

## 2021-12-16 DIAGNOSIS — R011 Cardiac murmur, unspecified: Secondary | ICD-10-CM | POA: Diagnosis not present

## 2021-12-16 DIAGNOSIS — Z6837 Body mass index (BMI) 37.0-37.9, adult: Secondary | ICD-10-CM | POA: Diagnosis not present

## 2021-12-23 ENCOUNTER — Other Ambulatory Visit: Payer: Self-pay | Admitting: Obstetrics and Gynecology

## 2021-12-23 DIAGNOSIS — N6489 Other specified disorders of breast: Secondary | ICD-10-CM

## 2021-12-25 DIAGNOSIS — C50912 Malignant neoplasm of unspecified site of left female breast: Secondary | ICD-10-CM | POA: Diagnosis not present

## 2021-12-25 DIAGNOSIS — F909 Attention-deficit hyperactivity disorder, unspecified type: Secondary | ICD-10-CM | POA: Diagnosis not present

## 2021-12-25 DIAGNOSIS — E7800 Pure hypercholesterolemia, unspecified: Secondary | ICD-10-CM | POA: Diagnosis not present

## 2021-12-25 DIAGNOSIS — E1169 Type 2 diabetes mellitus with other specified complication: Secondary | ICD-10-CM | POA: Diagnosis not present

## 2021-12-25 DIAGNOSIS — I358 Other nonrheumatic aortic valve disorders: Secondary | ICD-10-CM | POA: Diagnosis not present

## 2021-12-25 DIAGNOSIS — G4700 Insomnia, unspecified: Secondary | ICD-10-CM | POA: Diagnosis not present

## 2021-12-25 DIAGNOSIS — Z0000 Encounter for general adult medical examination without abnormal findings: Secondary | ICD-10-CM | POA: Diagnosis not present

## 2022-01-28 ENCOUNTER — Encounter: Payer: Medicare PPO | Admitting: Skilled Nursing Facility1

## 2022-02-01 ENCOUNTER — Ambulatory Visit: Payer: Medicare PPO | Admitting: Dietician

## 2022-02-15 ENCOUNTER — Encounter: Payer: Medicare PPO | Admitting: Dietician

## 2022-02-15 DIAGNOSIS — D649 Anemia, unspecified: Secondary | ICD-10-CM | POA: Diagnosis not present

## 2022-02-15 DIAGNOSIS — R195 Other fecal abnormalities: Secondary | ICD-10-CM | POA: Diagnosis not present

## 2022-02-15 DIAGNOSIS — K5900 Constipation, unspecified: Secondary | ICD-10-CM | POA: Diagnosis not present

## 2022-02-19 ENCOUNTER — Encounter: Payer: Self-pay | Admitting: Dietician

## 2022-02-19 ENCOUNTER — Encounter: Payer: Medicare PPO | Attending: Internal Medicine | Admitting: Dietician

## 2022-02-19 DIAGNOSIS — E119 Type 2 diabetes mellitus without complications: Secondary | ICD-10-CM | POA: Insufficient documentation

## 2022-02-19 NOTE — Patient Instructions (Addendum)
Aim to make 1/2 of your plate vegetables at least 2 meals per day.  Increase your gym from 1 day per week for one hour to 3 days per week for 1 hour.  Increase intake of fruits, vegetables, and whole grains.  Minimize added sugars and refined grains  Choose whole foods over processed.  Make simple meals at home more often than eating out.  Tips to increase fiber in your diet: (All plants have fiber.  Eat a variety. There are more than are on this list.) Slowly increase the amount of fiber you eat to 25-35 grams per day.  (More is fine if you tolerate it.) Fiber from whole grains, nuts and seeds Quinoa, 1/2 cup = 5 grams Bulgur, 1/2 cup = 4.1 grams Popcorn, 3 cups = 3.6 grams Whole Wheat Spaghetti, 1/2 cup = 3.2 grams Barley, 1/2 cup = 3 grams Oatmeal, 1/2 cup = 2 grams Whole Wheat English Muffin = 3 grams Corn, 1/2 cup = 2.1 grams Brown Rice, 1/2 cup = 1.8 grams Flax seeds, 1 Tablespoon = 2.8 grams Chia seeds, 1 Tablespoon = 11 grams Almonds, 1 ounce = 3.5 grams fiber Fiber from legumes Kidney beans, 1/2 cup 7.9 grams Lentils, 1/2 cup = 7.8 grams Pinto beans, 1/2 cup = 7.7 grams Black beans, 1/2 cup = 7.6 grams Lima beans, 1/2 cup 6.4 grams Chick peas, 1/2 cup = 5.3 grams Black eyed peas, 1/2 cup = 4 grams Fiber from fruits and vegetables Pear, 6 grams Apple. 3.3 grams Raspberries or Blackberries, 3/4 cup = 6 grams Strawberries or Blueberries, 1 cup = 3.4 grams Baked sweet potato 3.8 grams fiber Baked potato with skin 4.4 grams  Peas, 1/2 cup = 4.4 grams  Spinach, 1/2 cup cooked = 3.5 grams  Avocado, 1/2 = 5 grams

## 2022-02-19 NOTE — Progress Notes (Signed)
Diabetes Self-Management Education  Visit Type: Follow-up  Appt. Start Time: 1140 Appt. End Time: 1215  02/19/2022  [name], identified by name and date of birth, is a 66 y.o. female with a diagnosis of Diabetes:  .   ASSESSMENT  Primary concern: pt wants to learn how many carbohydrates she should have and what she should be eating at each meal.   History includes:  anemia, cancer, depression, type 2 diabetes, HTN.  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   Pt states she goes to the gym 1x/wk but wants to go more. States that she wants to lose weight and has been doing core exercises.   Education Topics: Impact of exercise on blood glucose Importance of hydration Carbohydrate counting MyPlate for Diabetes  Goals: Exercise 1 hour 3x/wk Make 1/2 of plate non-starchy vegetables at least twice daily Increase intake of fruits, vegetables, and whole grains  Height '5\' 7"'$  (1.702 m), weight 217 lb (98.4 kg). Body mass index is 33.99 kg/m.   Diabetes Self-Management Education - 02/19/22 1146       Visit Information   Visit Type Follow-up      Health Coping   How would you rate your overall health? Fair      Psychosocial Assessment   Patient Belief/Attitude about Diabetes Motivated to manage diabetes    What is the hardest part about your diabetes right now, causing you the most concern, or is the most worrisome to you about your diabetes?   Making healty food and beverage choices    Self-care barriers None    Self-management support Doctor's office    Other persons present Patient    Patient Concerns Healthy Lifestyle    Special Needs None    Preferred Learning Style No preference indicated    Learning Readiness Ready       Pre-Education Assessment   Patient understands the diabetes disease and treatment process. Comprehends key points    Patient understands incorporating nutritional management into lifestyle. Demonstrates understanding / competency    Patient undertands incorporating physical activity into lifestyle. Comprehends key points    Patient understands using medications safely. Demonstrates understanding / competency    Patient understands monitoring blood glucose, interpreting and using results Comprehends key points    Patient understands prevention, detection, and treatment of acute complications. Comprehends key points    Patient understands prevention, detection, and treatment of chronic complications. Compreheands key points    Patient understands how to develop strategies to address psychosocial issues. Comprehends key points    Patient understands how to develop strategies to promote health/change behavior. Comprehends key points      Complications   Last HgB A1C per patient/outside source 6.9 %   pt report   How often do you check your blood sugar? 0 times/day (not testing)      Dietary Intake   Breakfast yogurt and mixed nuts unsalted    Snack (morning) banana    Lunch leftovers (rice, wendys chili)    Snack (afternoon) none    Dinner rice and chili    Beverage(s) water      Activity / Exercise   Activity / Exercise Type ADL's    How many days per week do you exercise? 0  How many minutes per day do you exercise? 0    Total minutes per week of exercise 0      Patient Education   Previous Diabetes Education Yes (please comment)   RD   Disease Pathophysiology Explored patient's options for treatment of their diabetes    Healthy Eating Role of diet in the treatment of diabetes and the relationship between the three main macronutrients and blood glucose level;Carbohydrate counting;Plate Method;Information on hints to eating out and maintain blood glucose control.    Being Active Role  of exercise on diabetes management, blood pressure control and cardiac health.;Identified with patient nutritional and/or medication changes necessary with exercise.    Diabetes Stress and Support Worked with patient to identify barriers to care and solutions      Individualized Goals (developed by patient)   Nutrition Follow meal plan discussed    Physical Activity Exercise 3-5 times per week;60 minutes per day    Medications take my medication as prescribed    Monitoring  Test my blood glucose as discussed    Problem Solving Eating Pattern    Reducing Risk examine blood glucose patterns;do foot checks daily    Health Coping Ask for help with psychological, social, or emotional issues      Patient Self-Evaluation of Goals - Patient rates self as meeting previously set goals (% of time)   Nutrition 50 - 75 % (half of the time)    Physical Activity < 25% (hardly ever/never)    Medications 50 - 75 % (half of the time)    Monitoring < 25% (hardly ever/never)    Problem Solving and behavior change strategies  25 - 50% (sometimes)    Reducing Risk (treating acute and chronic complications) 25 - 44% (sometimes)    Health Coping 50 - 75 % (half of the time)      Post-Education Assessment   Patient understands the diabetes disease and treatment process. Comprehends key points    Patient understands incorporating nutritional management into lifestyle. Comprehends key points    Patient undertands incorporating physical activity into lifestyle. Comprehends key points    Patient understands using medications safely. Comphrehends key points    Patient understands monitoring blood glucose, interpreting and using results Comprehends key points    Patient understands prevention, detection, and treatment of acute complications. Comprehends key points    Patient understands prevention, detection, and treatment of chronic complications. Comprehends key points    Patient understands how to develop  strategies to address psychosocial issues. Comprehends key points    Patient understands how to develop strategies to promote health/change behavior. Comprehends key points      Outcomes   Expected Outcomes Demonstrated interest in learning. Expect positive outcomes    Future DMSE 3-4 months    Program Status Not Completed      Subsequent Visit   Since your last visit have you continued or begun to take your medications as prescribed? Yes    Since your last visit, are you checking your blood glucose at least once a day? No             Individualized Plan for Diabetes Self-Management Training:   Learning Objective:  Patient will have a greater understanding of diabetes self-management. Patient education plan is to attend individual and/or group sessions per assessed needs and concerns.   Plan:   Patient Instructions  Aim to make 1/2 of your plate vegetables at least 2 meals per day.  Increase your gym from 1  day per week for one hour to 3 days per week for 1 hour.  Increase intake of fruits, vegetables, and whole grains.  Minimize added sugars and refined grains  Choose whole foods over processed.  Make simple meals at home more often than eating out.  Tips to increase fiber in your diet: (All plants have fiber.  Eat a variety. There are more than are on this list.) Slowly increase the amount of fiber you eat to 25-35 grams per day.  (More is fine if you tolerate it.) Fiber from whole grains, nuts and seeds Quinoa, 1/2 cup = 5 grams Bulgur, 1/2 cup = 4.1 grams Popcorn, 3 cups = 3.6 grams Whole Wheat Spaghetti, 1/2 cup = 3.2 grams Barley, 1/2 cup = 3 grams Oatmeal, 1/2 cup = 2 grams Whole Wheat English Muffin = 3 grams Corn, 1/2 cup = 2.1 grams Brown Rice, 1/2 cup = 1.8 grams Flax seeds, 1 Tablespoon = 2.8 grams Chia seeds, 1 Tablespoon = 11 grams Almonds, 1 ounce = 3.5 grams fiber Fiber from legumes Kidney beans, 1/2 cup 7.9 grams Lentils, 1/2 cup = 7.8  grams Pinto beans, 1/2 cup = 7.7 grams Black beans, 1/2 cup = 7.6 grams Lima beans, 1/2 cup 6.4 grams Chick peas, 1/2 cup = 5.3 grams Black eyed peas, 1/2 cup = 4 grams Fiber from fruits and vegetables Pear, 6 grams Apple. 3.3 grams Raspberries or Blackberries, 3/4 cup = 6 grams Strawberries or Blueberries, 1 cup = 3.4 grams Baked sweet potato 3.8 grams fiber Baked potato with skin 4.4 grams  Peas, 1/2 cup = 4.4 grams  Spinach, 1/2 cup cooked = 3.5 grams  Avocado, 1/2 = 5 grams   Expected Outcomes:  Demonstrated interest in learning. Expect positive outcomes  Education material provided: Meal plan card and My Plate  If problems or questions, patient to contact team via:  Phone  Future DSME appointment: 3-4 months

## 2022-03-08 ENCOUNTER — Ambulatory Visit: Payer: Medicare PPO | Admitting: Skilled Nursing Facility1

## 2022-03-17 ENCOUNTER — Encounter: Payer: Self-pay | Admitting: Podiatry

## 2022-03-17 ENCOUNTER — Ambulatory Visit: Payer: Medicare PPO | Admitting: Podiatry

## 2022-03-17 DIAGNOSIS — E119 Type 2 diabetes mellitus without complications: Secondary | ICD-10-CM

## 2022-03-17 DIAGNOSIS — M79674 Pain in right toe(s): Secondary | ICD-10-CM | POA: Diagnosis not present

## 2022-03-17 DIAGNOSIS — M79675 Pain in left toe(s): Secondary | ICD-10-CM

## 2022-03-17 DIAGNOSIS — B351 Tinea unguium: Secondary | ICD-10-CM

## 2022-03-17 NOTE — Progress Notes (Signed)

## 2022-03-26 DIAGNOSIS — R195 Other fecal abnormalities: Secondary | ICD-10-CM | POA: Diagnosis not present

## 2022-03-26 DIAGNOSIS — D509 Iron deficiency anemia, unspecified: Secondary | ICD-10-CM | POA: Diagnosis not present

## 2022-03-26 DIAGNOSIS — K2930 Chronic superficial gastritis without bleeding: Secondary | ICD-10-CM | POA: Diagnosis not present

## 2022-03-26 DIAGNOSIS — K5730 Diverticulosis of large intestine without perforation or abscess without bleeding: Secondary | ICD-10-CM | POA: Diagnosis not present

## 2022-03-26 DIAGNOSIS — K648 Other hemorrhoids: Secondary | ICD-10-CM | POA: Diagnosis not present

## 2022-03-30 DIAGNOSIS — K2930 Chronic superficial gastritis without bleeding: Secondary | ICD-10-CM | POA: Diagnosis not present

## 2022-04-10 ENCOUNTER — Ambulatory Visit (HOSPITAL_COMMUNITY)
Admission: EM | Admit: 2022-04-10 | Discharge: 2022-04-10 | Disposition: A | Discharge status: Home / Self Care | Payer: Medicare PPO | Attending: Emergency Medicine | Admitting: Emergency Medicine

## 2022-04-10 ENCOUNTER — Encounter (HOSPITAL_COMMUNITY): Payer: Self-pay

## 2022-04-10 DIAGNOSIS — R053 Chronic cough: Secondary | ICD-10-CM

## 2022-04-10 DIAGNOSIS — J309 Allergic rhinitis, unspecified: Secondary | ICD-10-CM

## 2022-04-10 DIAGNOSIS — J3089 Other allergic rhinitis: Secondary | ICD-10-CM

## 2022-04-10 DIAGNOSIS — K047 Periapical abscess without sinus: Secondary | ICD-10-CM

## 2022-04-10 DIAGNOSIS — J302 Other seasonal allergic rhinitis: Secondary | ICD-10-CM | POA: Diagnosis not present

## 2022-04-10 MED ORDER — METRONIDAZOLE 500 MG PO TABS: 500.0000 mg | ORAL_TABLET | Freq: Two times a day (BID) | ORAL | 0 refills | Status: DC

## 2022-04-10 MED ORDER — CETIRIZINE HCL 10 MG PO TABS: 10.0000 mg | ORAL_TABLET | Freq: Every day | ORAL | 1 refills | Status: DC

## 2022-04-10 MED ORDER — PROMETHAZINE-DM 6.25-15 MG/5ML PO SYRP: 5.0000 mL | ORAL_SOLUTION | Freq: Every evening | ORAL | 0 refills | Status: DC | PRN

## 2022-04-10 MED ORDER — CEFDINIR 300 MG PO CAPS: 300.0000 mg | ORAL_CAPSULE | Freq: Two times a day (BID) | ORAL | 0 refills | Status: AC

## 2022-04-10 MED ORDER — METRONIDAZOLE 500 MG PO TABS: 500.0000 mg | ORAL_TABLET | Freq: Three times a day (TID) | ORAL | 0 refills | Status: AC

## 2022-04-10 MED ORDER — FLUTICASONE PROPIONATE 50 MCG/ACT NA SUSP: 1.0000 | Freq: Every day | NASAL | 2 refills | Status: DC

## 2022-04-10 NOTE — ED Triage Notes (Signed)
Chief Complaint: productive cough, chills, hot flashes, dental pain, white mucus.   Onset: Thursday  OTC medications tried: Yes- cough medication     with no relief  Sick exposure: No  New foods or medications: No  Recent Travel: No

## 2022-04-10 NOTE — ED Provider Notes (Signed)
[place]    CSN: [id number] Arrival date & time: 04/10/22  1548    HISTORY   Chief Complaint  Patient presents with   URI   Dental Pain   HPI [name] is a pleasant, 66 y.o. female who presents to urgent care today. Patient complains of cough productive of white mucus, chills, hot flashes, dental pain.  Patient states she has been taking over-the-counter cough medications without relief.  Patient denies known sick contacts.  Patient denies sore throat, sinus pressure, loss of taste or smell, body aches, chills, fever, nausea, vomiting, diarrhea.  Patient has elevated blood pressure on arrival today with otherwise normal vital signs.  The history is provided by the patient.   Past Medical History:  Diagnosis Date   Abnormal Pap smear 05/25/1991   Anemia    Bladder infection    Breast cancer ([employer])    Breast cancer, left ([place])    survivor; remission since 2008   Depression 07/16/1991   postpartum depression; non currently;    Diabetes mellitus without complication ([place])    Fibroids    h/o   H/O hyperlipidemia    H/O menorrhagia    Headache(784.0)    Hypertension    Oligomenorrhea    h/o   Ovarian cyst    h/o asymptomatic   Pain, female pelvic    h/o   Varicose veins    Mother   Patient Active Problem List   Diagnosis Date Noted   Heel callus 03/24/2020   Pain due to onychomycosis of toenails of both feet 05/07/2019   Diabetes mellitus without complication ([place]) 16/11/3708   Malignant neoplasm of overlapping sites of left breast in female, estrogen receptor positive ([place]) 08/12/2016   Ovarian cyst    H/O hyperlipidemia    Pain, female pelvic    Fibroids    Pelvic pain 09/27/2011   S/P endometrial ablation 09/27/2011   Chronic PID (chronic pelvic inflammatory disease) 09/23/2011   Pelvic adhesions 09/23/2011   Spasm of colon 09/23/2011   Past Surgical History:  Procedure Laterality Date   BREAST SURGERY     COLPOSCOPY  06/1991    DIAGNOSTIC LAPAROSCOPY  1994   HYSTEROSCOPY WITH D & C  07/20/2007   lysis of adhesions  Resolved 10/26/90   For pelvic adhesions   MASTECTOMY  2008   left breast   TUBAL LIGATION  1997   OB History     Gravida  2   Para  2   Term  2   Preterm  0   AB  0   Living  2      SAB  0   IAB  0   Ectopic  0   Multiple  0   Live Births             Home Medications    Prior to Admission medications   Medication Sig Start Date End Date Taking? Authorizing Provider  albuterol (VENTOLIN HFA) 108 (90 Base) MCG/ACT inhaler Inhale 1-2 puffs into the lungs every 6 (six) hours as needed for wheezing or shortness of breath. 08/28/20  Yes Hazel Sams, PA-C  ammonium lactate (LAC-HYDRIN) 12 % cream Apply topically as needed for dry skin. 08/14/21  Yes Edrick Kins, DPM  atorvastatin (LIPITOR) 10 MG tablet Take 10 mg by mouth daily.   Yes [provider]  clotrimazole (LOTRIMIN) 1 % cream Apply to affected area 2 times daily 10/04/19  Yes Melynda Ripple, MD  diclofenac  Sodium (VOLTAREN) 1 % GEL Apply 2 g topically 4 (four) times daily. 12/05/21  Yes Hans Eden, NP  fesoterodine (TOVIAZ) 8 MG TB24 tablet Take by mouth. 01/14/21  Yes [provider]  furosemide (LASIX) 20 MG tablet furosemide 20 mg tablet   Yes [provider]  lidocaine (XYLOCAINE) 5 % ointment Apply 1 Application topically 4 (four) times daily as needed. 12/05/21  Yes White, Leitha Schuller, NP  losartan (COZAAR) 25 MG tablet 1 tablet   Yes [provider]  metFORMIN (GLUCOPHAGE-XR) 750 MG 24 hr tablet 1 tablet 12/12/19  Yes [provider]  metroNIDAZOLE (METROGEL) 1 % gel Apply topically daily. 02/24/16  Yes Shawnee Knapp, MD  Multiple Vitamin (MULTI-VITAMIN) tablet Take 1 tablet by mouth daily.   Yes [provider]  Omega-3 Fatty Acids (OMEGA-3 FISH OIL PO) Omega 3 Fish Oil   Yes [provider]  promethazine-dextromethorphan (PROMETHAZINE-DM) 6.25-15  MG/5ML syrup Take 5 mLs by mouth 4 (four) times daily as needed for cough. 08/28/20  Yes Hazel Sams, PA-C  tolterodine (DETROL LA) 4 MG 24 hr capsule 1 capsule 03/03/20  Yes [provider]  VITAMIN D PO 5,000 unit capsules   Yes [provider]    Family History Family History  Problem Relation Age of Onset   Diabetes Sister    Hypertension Sister    Breast cancer Maternal Aunt    Breast cancer Maternal Aunt    Cancer Other    Hypertension Other    Diabetes Other    Social History Social History   Tobacco Use   Smoking status: Former    Types: Cigarettes    Quit date: 06/08/1981    Years since quitting: 40.8   Smokeless tobacco: Never  Vaping Use   Vaping Use: Never used  Substance Use Topics   Alcohol use: Not Currently    Comment: Rare   Drug use: No   Allergies   Other, Multihance [gadobenate dimeglumine], Amoxicillin, Amoxicillin [amoxicillin], Gluten meal, Iodinated contrast media, and Kiwi extract  Review of Systems Review of Systems Pertinent findings revealed after performing a 14 point review of systems has been noted in the history of present illness.  Physical Exam Triage Vital Signs ED Triage Vitals  Enc Vitals Group     BP 03/13/21 0827 (!) 147/82     Pulse Rate 03/13/21 0827 72     Resp 03/13/21 0827 18     Temp 03/13/21 0827 98.3 F (36.8 C)     Temp Source 03/13/21 0827 Oral     SpO2 03/13/21 0827 98 %     Weight --      Height --      Head Circumference --      Peak Flow --      Pain Score 03/13/21 0826 5     Pain Loc --      Pain Edu? --      Excl. in [place]? --   No data found.  Updated Vital Signs BP (!) 164/78 (BP Location: Right Arm)   Pulse 91   Temp 98.5 F (36.9 C) (Oral)   Resp 16   SpO2 95%   Physical Exam Vitals and nursing note reviewed.  Constitutional:      General: She is not in acute distress.    Appearance: Normal appearance. She is not ill-appearing.  HENT:     Head: Normocephalic and  atraumatic.     Salivary Glands: Right salivary gland is  not diffusely enlarged or tender. Left salivary gland is not diffusely enlarged or tender.     Right Ear: Ear canal and external ear normal. No drainage. A middle ear effusion is present. There is no impacted cerumen. Tympanic membrane is bulging. Tympanic membrane is not injected or erythematous.     Left Ear: Ear canal and external ear normal. No drainage. A middle ear effusion is present. There is no impacted cerumen. Tympanic membrane is bulging. Tympanic membrane is not injected or erythematous.     Ears:     Comments: Bilateral EACs normal, both TMs bulging with clear fluid    Nose: Rhinorrhea present. No nasal deformity, septal deviation, signs of injury, nasal tenderness, mucosal edema or congestion. Rhinorrhea is clear.     Right Nostril: Occlusion present. No foreign body, epistaxis or septal hematoma.     Left Nostril: Occlusion present. No foreign body, epistaxis or septal hematoma.     Right Turbinates: Enlarged, swollen and pale.     Left Turbinates: Enlarged, swollen and pale.     Right Sinus: No maxillary sinus tenderness or frontal sinus tenderness.     Left Sinus: No maxillary sinus tenderness or frontal sinus tenderness.     Mouth/Throat:     Lips: Pink. No lesions.     Mouth: Mucous membranes are moist. No oral lesions.     Dentition: Abnormal dentition.     Pharynx: Oropharynx is clear. Uvula midline. No posterior oropharyngeal erythema or uvula swelling.     Tonsils: No tonsillar exudate. 0 on the right. 0 on the left.      Comments: Postnasal drip Eyes:     General: Lids are normal.        Right eye: No discharge.        Left eye: No discharge.     Extraocular Movements: Extraocular movements intact.     Conjunctiva/sclera: Conjunctivae normal.     Right eye: Right conjunctiva is not injected.     Left eye: Left conjunctiva is not injected.  Neck:     Trachea: Trachea and phonation normal.  Cardiovascular:      Rate and Rhythm: Normal rate and regular rhythm.     Pulses: Normal pulses.     Heart sounds: Normal heart sounds. No murmur heard.    No friction rub. No gallop.  Pulmonary:     Effort: Pulmonary effort is normal. No accessory muscle usage, prolonged expiration or respiratory distress.     Breath sounds: Normal breath sounds. No stridor, decreased air movement or transmitted upper airway sounds. No decreased breath sounds, wheezing, rhonchi or rales.  Chest:     Chest wall: No tenderness.  Musculoskeletal:        General: Normal range of motion.     Cervical back: Normal range of motion and neck supple. Normal range of motion.  Lymphadenopathy:     Cervical: No cervical adenopathy.  Skin:    General: Skin is warm and dry.     Findings: No erythema or rash.  Neurological:     General: No focal deficit present.     Mental Status: She is alert and oriented to person, place, and time.  Psychiatric:        Mood and Affect: Mood normal.        Behavior: Behavior normal.     Visual Acuity Right Eye Distance:   Left Eye Distance:   Bilateral Distance:    Right Eye Near:   Left Eye Near:  Bilateral Near:     UC Couse / Diagnostics / Procedures:     Radiology No results found.  Procedures Procedures (including critical care time) EKG  Pending results:  Labs Reviewed - No data to display  Medications Ordered in UC: Medications - No data to display  UC Diagnoses / Final Clinical Impressions(s)   I have reviewed the triage vital signs and the nursing notes.  Pertinent labs & imaging results that were available during my care of the patient were reviewed by me and considered in my medical decision making (see chart for details).    Final diagnoses:  Perennial allergic rhinitis with seasonal variation  Allergic rhinitis with postnasal drip  Persistent cough  Dental abscess   Patient provided with a 10-day course of cefdinir and metronidazole due to patient  report of allergies amoxicillin, reaction and swelling.  Patient also provided with allergy medications and Promethazine DM for nighttime cough or upper respiratory complaints.  Return precautions advised.  ED Prescriptions     Medication Sig Dispense Auth. Provider   cefdinir (OMNICEF) 300 MG capsule Take 1 capsule (300 mg total) by mouth 2 (two) times daily for 10 days. 20 capsule Lynden Oxford Scales, PA-C   cetirizine (ZYRTEC ALLERGY) 10 MG tablet Take 1 tablet (10 mg total) by mouth at bedtime. 90 tablet Lynden Oxford Scales, PA-C   fluticasone (FLONASE) 50 MCG/ACT nasal spray Place 1 spray into both nostrils daily. Begin by using 2 sprays in each nare daily for 3 to 5 days, then decrease to 1 spray in each nare daily. 15.8 mL Lynden Oxford Scales, PA-C   promethazine-dextromethorphan (PROMETHAZINE-DM) 6.25-15 MG/5ML syrup Take 5 mLs by mouth at bedtime as needed for cough. 60 mL Lynden Oxford Scales, PA-C   metroNIDAZOLE (FLAGYL) 500 MG tablet Take 1 tablet (500 mg total) by mouth 3 (three) times daily for 10 days. 30 tablet Lynden Oxford Scales, PA-C      PDMP not reviewed this encounter.  Disposition Upon Discharge:  Condition: stable for discharge home Home: take medications as prescribed; routine discharge instructions as discussed; follow up as advised.  Patient presented with an acute illness with associated systemic symptoms and significant discomfort requiring urgent management. In my opinion, this is a condition that a prudent lay person (someone who possesses an average knowledge of health and medicine) may potentially expect to result in complications if not addressed urgently such as respiratory distress, impairment of bodily function or dysfunction of bodily organs.   Routine symptom specific, illness specific and/or disease specific instructions were discussed with the patient and/or caregiver at length.   As such, the patient has been evaluated and assessed,  work-up was performed and treatment was provided in alignment with urgent care protocols and evidence based medicine.  Patient/parent/caregiver has been advised that the patient may require follow up for further testing and treatment if the symptoms continue in spite of treatment, as clinically indicated and appropriate.  If the patient was tested for COVID-19, Influenza and/or RSV, then the patient/parent/guardian was advised to isolate at home pending the results of his/her diagnostic coronavirus test and potentially longer if they're positive. I have also advised pt that if his/her COVID-19 test returns positive, it's recommended to self-isolate for at least 10 days after symptoms first appeared AND until fever-free for 24 hours without fever reducer AND other symptoms have improved or resolved. Discussed self-isolation recommendations as well as instructions for household member/close contacts as per the CDC and [place] DHHS, and also gave  patient the COVID packet with this information.  Patient/parent/caregiver has been advised to return to the [facility] or PCP in 3-5 days if no better; to PCP or the Emergency Department if new signs and symptoms develop, or if the current signs or symptoms continue to change or worsen for further workup, evaluation and treatment as clinically indicated and appropriate  The patient will follow up with their current PCP if and as advised. If the patient does not currently have a PCP we will assist them in obtaining one.   The patient may need specialty follow up if the symptoms continue, in spite of conservative treatment and management, for further workup, evaluation, consultation and treatment as clinically indicated and appropriate.  Patient/parent/caregiver verbalized understanding and agreement of plan as discussed.  All questions were addressed during visit.  Please see discharge instructions below for further details of plan.  Discharge Instructions:   Discharge  Instructions      Please read below to learn more about the medications, dosages and frequencies that I recommend to help alleviate your symptoms and to get you feeling better soon:   Omnicef (cefdinir):  1 capsule twice daily for 10 days, you can take it with or without food.  This antibiotic can cause upset stomach, this will resolve once antibiotics are complete.  You are welcome to use a probiotic, eat yogurt, take Imodium while taking this medication.  Please avoid other systemic medications such as Maalox, Pepto-Bismol or milk of magnesia as they can interfere with your body's ability to absorb the antibiotics.       Flagyl (metronidazole): Take 1 tablet 3 times daily for the next 10 days.     This antibiotic can cause upset stomach, this will resolve once antibiotics are complete.  You are welcome to use a probiotic, eat yogurt, take Imodium while taking this medication.  Please avoid other systemic medications such as Maalox, Pepto-Bismol or milk of magnesia as they can interfere with your body's ability to absorb the antibiotics.   Zyrtec (cetirizine): This is an excellent second-generation antihistamine that helps to reduce respiratory inflammatory response to environmental allergens.  In some patients, this medication can cause daytime sleepiness so I recommend that you take 1 tablet daily at bedtime.     Flonase (fluticasone): This is a steroid nasal spray that you use once daily, 1 spray in each nare.  This medication does not work well if you decide to use it only used as you feel you need to, it works best used on a daily basis.  After 3 to 5 days of use, you will notice significant reduction of the inflammation and mucus production that is currently being caused by exposure to allergens, whether seasonal or environmental.  The most common side effect of this medication is nosebleeds.  If you experience a nosebleed, please discontinue use for 1 week, then feel free to resume.  I have  provided you with a prescription.     If you find that you have not had improvement of your symptoms in the next 5 to 7 days, please follow-up with your primary care provider or return here to urgent care for repeat evaluation and further recommendations.   Thank you for visiting urgent care today.  We appreciate the opportunity to participate in your care.         This office note has been dictated using Museum/gallery curator.  Unfortunately, this method of dictation can sometimes lead to typographical or grammatical errors.  I apologize for your inconvenience in advance if this occurs.  Please do not hesitate to reach out to me if clarification is needed.      Lynden Oxford Scales, [place] 04/10/22 1809

## 2022-04-10 NOTE — Discharge Instructions (Addendum)
Please read below to learn more about the medications, dosages and frequencies that I recommend to help alleviate your symptoms and to get you feeling better soon:   Omnicef (cefdinir):  1 capsule twice daily for 10 days, you can take it with or without food.  This antibiotic can cause upset stomach, this will resolve once antibiotics are complete.  You are welcome to use a probiotic, eat yogurt, take Imodium while taking this medication.  Please avoid other systemic medications such as Maalox, Pepto-Bismol or milk of magnesia as they can interfere with your body's ability to absorb the antibiotics.       Flagyl (metronidazole): Take 1 tablet 3 times daily for the next 10 days.     This antibiotic can cause upset stomach, this will resolve once antibiotics are complete.  You are welcome to use a probiotic, eat yogurt, take Imodium while taking this medication.  Please avoid other systemic medications such as Maalox, Pepto-Bismol or milk of magnesia as they can interfere with your body's ability to absorb the antibiotics.   Zyrtec (cetirizine): This is an excellent second-generation antihistamine that helps to reduce respiratory inflammatory response to environmental allergens.  In some patients, this medication can cause daytime sleepiness so I recommend that you take 1 tablet daily at bedtime.     Flonase (fluticasone): This is a steroid nasal spray that you use once daily, 1 spray in each nare.  This medication does not work well if you decide to use it only used as you feel you need to, it works best used on a daily basis.  After 3 to 5 days of use, you will notice significant reduction of the inflammation and mucus production that is currently being caused by exposure to allergens, whether seasonal or environmental.  The most common side effect of this medication is nosebleeds.  If you experience a nosebleed, please discontinue use for 1 week, then feel free to resume.  I have provided you with a  prescription.     If you find that you have not had improvement of your symptoms in the next 5 to 7 days, please follow-up with your primary care provider or return here to urgent care for repeat evaluation and further recommendations.   Thank you for visiting urgent care today.  We appreciate the opportunity to participate in your care.

## 2022-04-22 DIAGNOSIS — K0889 Other specified disorders of teeth and supporting structures: Secondary | ICD-10-CM | POA: Diagnosis not present

## 2022-04-22 DIAGNOSIS — J069 Acute upper respiratory infection, unspecified: Secondary | ICD-10-CM | POA: Diagnosis not present

## 2022-04-30 DIAGNOSIS — C50912 Malignant neoplasm of unspecified site of left female breast: Secondary | ICD-10-CM | POA: Diagnosis not present

## 2022-05-14 DIAGNOSIS — C50912 Malignant neoplasm of unspecified site of left female breast: Secondary | ICD-10-CM | POA: Diagnosis not present

## 2022-05-21 ENCOUNTER — Ambulatory Visit: Payer: Medicare PPO | Admitting: Dietician

## 2022-05-25 DIAGNOSIS — C50912 Malignant neoplasm of unspecified site of left female breast: Secondary | ICD-10-CM | POA: Diagnosis not present

## 2022-05-31 ENCOUNTER — Encounter: Payer: Self-pay | Admitting: Dietician

## 2022-05-31 ENCOUNTER — Encounter: Payer: Medicare PPO | Attending: Endocrinology | Admitting: Dietician

## 2022-05-31 VITALS — Ht 67.0 in | Wt 225.0 lb

## 2022-05-31 DIAGNOSIS — E1165 Type 2 diabetes mellitus with hyperglycemia: Secondary | ICD-10-CM | POA: Diagnosis not present

## 2022-05-31 NOTE — Progress Notes (Signed)
Diabetes Self-Management Education  Visit Type: Follow-up  Appt. Start Time: 1520 Appt. End Time: 1600  05/31/2022  [name], identified by name and date of birth, is a 67 y.o. female with a diagnosis of Diabetes: Type 2.   ASSESSMENT  Primary concern: pt wants to learn how many carbohydrates she should have and what she should be eating at each meal.    History includes:  anemia, cancer, depression, type 2 diabetes, HTN.  Labs noted: reviewed Medications include: metformin Supplements: MVI, omega 3, vitamin D   Anthropometrics Wt 02/19/22: 217lbs Wt 05/31/22: 225lbs Ht: 67in   Goals: Exercise 1 hour 3x/wk Make 1/2 of plate non-starchy vegetables at least twice daily Increase intake of fruits, vegetables, and whole grains  Pt has been exercising 1 hour 1x/wk.   Height '5\' 7"'$  (1.702 m), weight 225 lb (102.1 kg). Body mass index is 35.24 kg/m.   Diabetes Self-Management Education - 05/31/22 1526       Visit Information   Visit Type Follow-up      Health Coping   How would you rate your overall health? Good      Psychosocial Assessment   Patient Belief/Attitude about Diabetes Motivated to manage diabetes    What is the hardest part about your diabetes right now, causing you the most concern, or is the most worrisome to you about your diabetes?   Making healty food and beverage choices    Self-care barriers None    Self-management support Doctor's office    Other persons present Patient    Patient Concerns Nutrition/Meal planning    Special Needs None    Preferred Learning Style No preference indicated    Learning Readiness Ready      Pre-Education Assessment   Patient understands the diabetes disease and treatment process. Needs Review    Patient understands incorporating nutritional management into lifestyle. Needs Review    Patient undertands incorporating physical activity into lifestyle. Needs Review    Patient understands using medications  safely. Needs Review    Patient understands monitoring blood glucose, interpreting and using results Needs Review    Patient understands prevention, detection, and treatment of acute complications. Needs Review    Patient understands prevention, detection, and treatment of chronic complications. Needs Review    Patient understands how to develop strategies to address psychosocial issues. Needs Review    Patient understands how to develop strategies to promote health/change behavior. Needs Review      Dietary Intake   Breakfast toast with eggs and [place] bacon OR oatmeal with berries    Lunch reuben sandwich OR shrimp fried rice    Dinner lima beans with trout and beets    Beverage(s) water, juice no sugar added,      Activity / Exercise   Activity / Exercise Type Light (walking / raking leaves)    How many days per week do you exercise? 1    How many minutes per day do you exercise? 60    Total minutes per week of exercise 60      Patient Education   Previous Diabetes Education Yes (please comment)    Disease Pathophysiology Explored patient's options for treatment of their diabetes    Healthy Eating Role of diet in the treatment of diabetes and the relationship between the three main macronutrients and blood glucose level;Food label reading, portion sizes and measuring food.;Plate Method;Carbohydrate counting;Reviewed blood glucose goals for pre and post meals and how to evaluate the patients' food intake on  their blood glucose level.;Information on hints to eating out and maintain blood glucose control.;Meal options for control of blood glucose level and chronic complications.;Meal timing in regards to the patients' current diabetes medication.    Being Active Role of exercise on diabetes management, blood pressure control and cardiac health.    Medications Reviewed medication adjustment guidelines for hyperglycemia and sick days.;Reviewed patients medication for diabetes, action, purpose,  timing of dose and side effects.    Monitoring Identified appropriate SMBG and/or A1C goals.;Daily foot exams;Yearly dilated eye exam    Acute complications Discussed and identified patients' prevention, symptoms, and treatment of hyperglycemia.    Chronic complications Relationship between chronic complications and blood glucose control;Assessed and discussed foot care and prevention of foot problems;Lipid levels, blood glucose control and heart disease;Identified and discussed with patient  current chronic complications    Diabetes Stress and Support Identified and addressed patients feelings and concerns about diabetes;Worked with patient to identify barriers to care and solutions;Role of stress on diabetes    Lifestyle and Health Coping Lifestyle issues that need to be addressed for better diabetes care      Individualized Goals (developed by patient)   Nutrition General guidelines for healthy choices and portions discussed    Physical Activity Exercise 3-5 times per week;60 minutes per day    Medications take my medication as prescribed    Monitoring  Test my blood glucose as discussed    Problem Solving Eating Pattern    Reducing Risk examine blood glucose patterns;treat hypoglycemia with 15 grams of carbs if blood glucose less than '70mg'$ /dL;do foot checks daily    Health Coping Ask for help with psychological, social, or emotional issues      Patient Self-Evaluation of Goals - Patient rates self as meeting previously set goals (% of time)   Nutrition 50 - 75 % (half of the time)    Physical Activity < 25% (hardly ever/never)    Medications >75% (most of the time)    Monitoring 25 - 50% (sometimes)    Problem Solving and behavior change strategies  25 - 50% (sometimes)    Reducing Risk (treating acute and chronic complications) 25 - 40% (sometimes)    Health Coping 25 - 50% (sometimes)      Post-Education Assessment   Patient understands the diabetes disease and treatment process.  Comprehends key points    Patient understands incorporating nutritional management into lifestyle. Comprehends key points    Patient undertands incorporating physical activity into lifestyle. Comprehends key points    Patient understands using medications safely. Demonstrates understanding / competency    Patient understands monitoring blood glucose, interpreting and using results Comprehends key points    Patient understands prevention, detection, and treatment of acute complications. Comprehends key points    Patient understands prevention, detection, and treatment of chronic complications. Comprehends key points    Patient understands how to develop strategies to address psychosocial issues. Comprehends key points    Patient understands how to develop strategies to promote health/change behavior. Comprehends key points      Outcomes   Expected Outcomes Demonstrated interest in learning. Expect positive outcomes    Future DMSE 3-4 months    Program Status Not Completed      Subsequent Visit   Since your last visit have you continued or begun to take your medications as prescribed? Yes             Individualized Plan for Diabetes Self-Management Training:   Learning Objective:  Patient  will have a greater understanding of diabetes self-management. Patient education plan is to attend individual and/or group sessions per assessed needs and concerns.   Plan:   Patient Instructions  Aim for 150 minutes of physical activity weekly. Goal: Exercise 1 hour 3x/wk  Eat more Non-Starchy Vegetables Goal: aim to make 1/2 of your plate vegetables at least 2x/day  Make 1/2 of plate non-starchy vegetables at least twice daily  Carbohydrate goals Snacks: 15g carbohydrates Meals: 30-45g carbohydrates  Expected Outcomes:  Demonstrated interest in learning. Expect positive outcomes  Education material provided: Meal plan card, My Plate, and Snack sheet, Balanced Plate Food Groups   If  problems or questions, patient to contact team via:  Phone  Future DSME appointment: 3-4 months

## 2022-05-31 NOTE — Patient Instructions (Addendum)
Aim for 150 minutes of physical activity weekly. Goal: Exercise 1 hour 3x/wk  Eat more Non-Starchy Vegetables Goal: aim to make 1/2 of your plate vegetables at least 2x/day  Carbohydrate goals Snacks: 15g carbohydrates Meals: 30-45g carbohydrates

## 2022-06-05 ENCOUNTER — Encounter: Payer: Self-pay | Admitting: Emergency Medicine

## 2022-06-05 ENCOUNTER — Ambulatory Visit
Admission: EM | Admit: 2022-06-05 | Discharge: 2022-06-05 | Disposition: A | Discharge status: Home / Self Care | Payer: Medicare PPO | Attending: Physician Assistant | Admitting: Physician Assistant

## 2022-06-05 ENCOUNTER — Other Ambulatory Visit: Payer: Self-pay

## 2022-06-05 DIAGNOSIS — K047 Periapical abscess without sinus: Secondary | ICD-10-CM | POA: Diagnosis not present

## 2022-06-05 DIAGNOSIS — M25562 Pain in left knee: Secondary | ICD-10-CM

## 2022-06-05 DIAGNOSIS — J320 Chronic maxillary sinusitis: Secondary | ICD-10-CM

## 2022-06-05 MED ORDER — PREDNISONE 20 MG PO TABS: 40.0000 mg | ORAL_TABLET | Freq: Every day | ORAL | 0 refills | Status: AC

## 2022-06-05 MED ORDER — DOXYCYCLINE HYCLATE 100 MG PO CAPS: 100.0000 mg | ORAL_CAPSULE | Freq: Two times a day (BID) | ORAL | 0 refills | Status: DC

## 2022-06-05 NOTE — Discharge Instructions (Addendum)
  Unable to locate record for additional allergy med. I recommend you follow up with your primary care provider for refills of this.

## 2022-06-05 NOTE — ED Provider Notes (Signed)
[facility]    CSN: [id number] Arrival date & time: 06/05/22  1514      History   Chief Complaint Chief Complaint  Patient presents with   Nasal Congestion   Dental Pain   Knee Pain    HPI [name] is a 67 y.o. female.   Patient here today for multiple issues.  Reports that she has had nasal congestion and sinus pressure for the last 9 months to a year.  She states she is taking cetirizine daily and did have another allergy medicine prescribed but cannot recall which one it was.    She also states that she has had some significant left knee pain recently.  She notes she had trouble getting out of her bed which is not typical for her.  She has seen Ortho but states she was recommended surgery which she does not want to proceed with at this time.  She has not had any recent injury.  She would like evaluation for right-sided dental pain.  She notes she is told in the past that she had dental abscess.  She does not report recent fever.  The history is provided by the patient.    Past Medical History:  Diagnosis Date   Abnormal Pap smear 05/25/1991   Anemia    Bladder infection    Breast cancer ([place])    Breast cancer, left ([place])    survivor; remission since 2008   Depression 07/16/1991   postpartum depression; non currently;    Diabetes mellitus without complication ([place])    Fibroids    h/o   H/O hyperlipidemia    H/O menorrhagia    Headache(784.0)    Hypertension    Oligomenorrhea    h/o   Ovarian cyst    h/o asymptomatic   Pain, female pelvic    h/o   Varicose veins    Mother    Patient Active Problem List   Diagnosis Date Noted   Heel callus 03/24/2020   Pain due to onychomycosis of toenails of both feet 05/07/2019   Diabetes mellitus without complication ([place]) 68/04/7516   Malignant neoplasm of overlapping sites of left breast in female, estrogen receptor positive ([place]) 08/12/2016   Ovarian cyst    H/O hyperlipidemia    Pain,  female pelvic    Fibroids    Pelvic pain 09/27/2011   S/P endometrial ablation 09/27/2011   Chronic PID (chronic pelvic inflammatory disease) 09/23/2011   Pelvic adhesions 09/23/2011   Spasm of colon 09/23/2011    Past Surgical History:  Procedure Laterality Date   BREAST SURGERY     COLPOSCOPY  06/1991   DIAGNOSTIC LAPAROSCOPY  1994   HYSTEROSCOPY WITH D & C  07/20/2007   lysis of adhesions  Resolved 10/26/90   For pelvic adhesions   MASTECTOMY  2008   left breast   TUBAL LIGATION  1997    OB History     Gravida  2   Para  2   Term  2   Preterm  0   AB  0   Living  2      SAB  0   IAB  0   Ectopic  0   Multiple  0   Live Births               Home Medications    Prior to Admission medications   Medication Sig Start Date End Date Taking? Authorizing Provider  doxycycline (VIBRAMYCIN) 100 MG capsule Take  1 capsule (100 mg total) by mouth 2 (two) times daily. 06/05/22  Yes Francene Finders, PA-C  predniSONE (DELTASONE) 20 MG tablet Take 2 tablets (40 mg total) by mouth daily with breakfast for 5 days. 06/05/22 06/10/22 Yes Francene Finders, PA-C  albuterol (VENTOLIN HFA) 108 (90 Base) MCG/ACT inhaler Inhale 1-2 puffs into the lungs every 6 (six) hours as needed for wheezing or shortness of breath. 08/28/20   Hazel Sams, PA-C  ammonium lactate (LAC-HYDRIN) 12 % cream Apply topically as needed for dry skin. 08/14/21   Edrick Kins, DPM  atorvastatin (LIPITOR) 10 MG tablet Take 10 mg by mouth daily.    [provider]  cetirizine (ZYRTEC ALLERGY) 10 MG tablet Take 1 tablet (10 mg total) by mouth at bedtime. 04/10/22 10/07/22  Lynden Oxford Scales, PA-C  clotrimazole (LOTRIMIN) 1 % cream Apply to affected area 2 times daily 10/04/19   Melynda Ripple, MD  diclofenac Sodium (VOLTAREN) 1 % GEL Apply 2 g topically 4 (four) times daily. 12/05/21   Hans Eden, NP  fesoterodine (TOVIAZ) 8 MG TB24 tablet Take by mouth. 01/14/21   [provider]  fluticasone (FLONASE) 50 MCG/ACT nasal spray Place 1 spray into both nostrils daily. Begin by using 2 sprays in each nare daily for 3 to 5 days, then decrease to 1 spray in each nare daily. 04/10/22   Lynden Oxford Scales, PA-C  furosemide (LASIX) 20 MG tablet furosemide 20 mg tablet    [provider]  lidocaine (XYLOCAINE) 5 % ointment Apply 1 Application topically 4 (four) times daily as needed. 12/05/21   Hans Eden, NP  losartan (COZAAR) 25 MG tablet 1 tablet    [provider]  metFORMIN (GLUCOPHAGE-XR) 750 MG 24 hr tablet 1 tablet 12/12/19   [provider]  metroNIDAZOLE (METROGEL) 1 % gel Apply topically daily. 02/24/16   Shawnee Knapp, MD  Multiple Vitamin (MULTI-VITAMIN) tablet Take 1 tablet by mouth daily.    [provider]  Omega-3 Fatty Acids (OMEGA-3 FISH OIL PO) Omega 3 Fish Oil    [provider]  promethazine-dextromethorphan (PROMETHAZINE-DM) 6.25-15 MG/5ML syrup Take 5 mLs by mouth at bedtime as needed for cough. 04/10/22   Lynden Oxford Scales, PA-C  tolterodine (DETROL LA) 4 MG 24 hr capsule 1 capsule 03/03/20   [provider]  VITAMIN D PO 5,000 unit capsules    [provider]    Family History Family History  Problem Relation Age of Onset   Diabetes Sister    Hypertension Sister    Breast cancer Maternal Aunt    Breast cancer Maternal Aunt    Cancer Other    Hypertension Other    Diabetes Other     Social History Social History   Tobacco Use   Smoking status: Former    Types: Cigarettes    Quit date: 06/08/1981    Years since quitting: 41.0   Smokeless tobacco: Never  Vaping Use   Vaping Use: Never used  Substance Use Topics   Alcohol use: Not Currently    Comment: Rare   Drug use: No     Allergies   Other, Multihance [gadobenate dimeglumine], Amoxicillin, Amoxicillin [amoxicillin], Gluten meal, Iodinated contrast media, and Kiwi extract   Review of  Systems Review of Systems  Constitutional:  Negative for chills and fever.  HENT:  Positive for congestion, dental problem and sinus pressure.   Eyes:  Negative for discharge and redness.  Respiratory:  Negative for shortness of breath.   Gastrointestinal:  Negative for abdominal pain, nausea and vomiting.  Musculoskeletal:  Positive for arthralgias.     Physical Exam Triage Vital Signs ED Triage Vitals  Enc Vitals Group     BP      Pulse      Resp      Temp      Temp src      SpO2      Weight      Height      Head Circumference      Peak Flow      Pain Score      Pain Loc      Pain Edu?      Excl. in [place]?    No data found.  Updated Vital Signs BP (!) 154/74 (BP Location: Left Arm)   Pulse 92   Temp 97.8 F (36.6 C) (Oral)   Resp 18   SpO2 95%   Physical Exam Vitals and nursing note reviewed.  Constitutional:      General: She is not in acute distress.    Appearance: Normal appearance. She is not ill-appearing.  HENT:     Head: Normocephalic and atraumatic.     Nose: Congestion present.     Mouth/Throat:     Comments: Poor dentition throughout- significant caries noted to left upper molars Eyes:     Conjunctiva/sclera: Conjunctivae normal.  Cardiovascular:     Rate and Rhythm: Normal rate.  Pulmonary:     Effort: Pulmonary effort is normal. No respiratory distress.  Musculoskeletal:     Comments: Full ROM of right and left knee  Neurological:     Mental Status: She is alert.  Psychiatric:        Mood and Affect: Mood normal.        Behavior: Behavior normal.        Thought Content: Thought content normal.      UC Treatments / Results  Labs (all labs ordered are listed, but only abnormal results are displayed) Labs Reviewed - No data to display  EKG   Radiology No results found.  Procedures Procedures (including critical care time)  Medications Ordered in UC Medications - No data to display  Initial Impression / Assessment and Plan /  UC Course  I have reviewed the triage vital signs and the nursing notes.  Pertinent labs & imaging results that were available during my care of the patient were reviewed by me and considered in my medical decision making (see chart for details).    Doxycycline prescribed to cover dental abscess as well as suspected sinusitis.  Prednisone burst prescribed to cover suspected arthritis related pain in knee.  Encouraged follow-up with Ortho.  Recommended sooner follow-up with any persistent or worsening symptoms.  Final Clinical Impressions(s) / UC Diagnoses   Final diagnoses:  Acute pain of left knee  Dental abscess  Chronic maxillary sinusitis     Discharge Instructions       Unable to locate record for additional allergy med. I recommend you follow up with your primary care provider for refills of this.      ED Prescriptions     Medication Sig Dispense Auth. Provider   doxycycline (VIBRAMYCIN) 100 MG capsule Take 1 capsule (100 mg total) by mouth 2 (two) times daily. 20 capsule Ewell Poe F, PA-C   predniSONE (DELTASONE) 20 MG tablet Take 2 tablets (40 mg total) by mouth daily with breakfast for 5 days. 10  tablet Francene Finders, PA-C      PDMP not reviewed this encounter.   Francene Finders, PA-C 06/05/22 1551

## 2022-06-05 NOTE — ED Triage Notes (Signed)
Pt here for nasal congestion x 9 months; pt sts left knee pain and trouble getting out of bed yesterday; denies injury; pt sts right sided dental pain

## 2022-06-24 ENCOUNTER — Ambulatory Visit: Payer: Medicare PPO | Admitting: Podiatry

## 2022-07-22 ENCOUNTER — Ambulatory Visit: Payer: Medicare PPO | Admitting: Podiatry

## 2022-07-22 ENCOUNTER — Encounter: Payer: Self-pay | Admitting: Podiatry

## 2022-07-22 VITALS — BP 144/64 | HR 86

## 2022-07-22 DIAGNOSIS — M79674 Pain in right toe(s): Secondary | ICD-10-CM

## 2022-07-22 DIAGNOSIS — B351 Tinea unguium: Secondary | ICD-10-CM

## 2022-07-22 DIAGNOSIS — M79675 Pain in left toe(s): Secondary | ICD-10-CM | POA: Diagnosis not present

## 2022-07-22 DIAGNOSIS — E119 Type 2 diabetes mellitus without complications: Secondary | ICD-10-CM

## 2022-07-22 NOTE — Progress Notes (Signed)
This patient returns to my office for at risk foot care.  This patient requires this care by a professional since this patient will be at risk due to having diabetes.    This patient is unable to cut nails herself since the patient cannot reach her nails.These nails are painful walking and wearing shoes.  This patient presents for at risk foot care today.    General Appearance  Alert, conversant and in no acute stress.  Vascular  Dorsalis pedis and posterior tibial  pulses are palpable  bilaterally.  Capillary return is within normal limits  bilaterally. Temperature is within normal limits  bilaterally.  Neurologic  Senn-Weinstein monofilament wire test within normal limits  bilaterally. Muscle power within normal limits bilaterally.  Nails Thick disfigured discolored nails with subungual debris  from hallux to fifth toes bilaterally. No evidence of bacterial infection or drainage bilaterally.  Orthopedic  No limitations of motion  feet .  No crepitus or effusions noted.  No bony pathology or digital deformities noted.  HAV  B/L  Skin  normotropic skin with no porokeratosis noted bilaterally.  No signs of infections or ulcers noted.   Onychomycosis  Pain in right toes  Pain in left toes    Consent was obtained for treatment procedures.   Mechanical debridement of nails 1-5  bilaterally performed with a nail nipper.  Filed with dremel without incident.    Return office visit    3 months                 Told patient to return for periodic foot care and evaluation due to potential at risk complications.   Gardiner Barefoot DPM

## 2022-07-28 DIAGNOSIS — M25562 Pain in left knee: Secondary | ICD-10-CM | POA: Diagnosis not present

## 2022-07-28 DIAGNOSIS — M170 Bilateral primary osteoarthritis of knee: Secondary | ICD-10-CM | POA: Diagnosis not present

## 2022-07-28 DIAGNOSIS — M25561 Pain in right knee: Secondary | ICD-10-CM | POA: Diagnosis not present

## 2022-07-28 DIAGNOSIS — R262 Difficulty in walking, not elsewhere classified: Secondary | ICD-10-CM | POA: Diagnosis not present

## 2022-08-05 DIAGNOSIS — M170 Bilateral primary osteoarthritis of knee: Secondary | ICD-10-CM | POA: Diagnosis not present

## 2022-08-05 DIAGNOSIS — R262 Difficulty in walking, not elsewhere classified: Secondary | ICD-10-CM | POA: Diagnosis not present

## 2022-08-05 DIAGNOSIS — M25562 Pain in left knee: Secondary | ICD-10-CM | POA: Diagnosis not present

## 2022-08-05 DIAGNOSIS — M25561 Pain in right knee: Secondary | ICD-10-CM | POA: Diagnosis not present

## 2022-08-12 DIAGNOSIS — M170 Bilateral primary osteoarthritis of knee: Secondary | ICD-10-CM | POA: Diagnosis not present

## 2022-08-12 DIAGNOSIS — M25561 Pain in right knee: Secondary | ICD-10-CM | POA: Diagnosis not present

## 2022-08-12 DIAGNOSIS — M25562 Pain in left knee: Secondary | ICD-10-CM | POA: Diagnosis not present

## 2022-08-19 DIAGNOSIS — R262 Difficulty in walking, not elsewhere classified: Secondary | ICD-10-CM | POA: Diagnosis not present

## 2022-08-19 DIAGNOSIS — M25562 Pain in left knee: Secondary | ICD-10-CM | POA: Diagnosis not present

## 2022-08-19 DIAGNOSIS — M170 Bilateral primary osteoarthritis of knee: Secondary | ICD-10-CM | POA: Diagnosis not present

## 2022-08-19 DIAGNOSIS — M25561 Pain in right knee: Secondary | ICD-10-CM | POA: Diagnosis not present

## 2022-09-02 ENCOUNTER — Encounter: Payer: Self-pay | Admitting: Dietician

## 2022-09-02 ENCOUNTER — Encounter: Payer: Medicare PPO | Attending: Endocrinology | Admitting: Dietician

## 2022-09-02 VITALS — Wt 224.0 lb

## 2022-09-02 DIAGNOSIS — E119 Type 2 diabetes mellitus without complications: Secondary | ICD-10-CM | POA: Diagnosis not present

## 2022-09-02 NOTE — Progress Notes (Signed)
Diabetes Self-Management Education  Visit Type: Follow-up  Appt. Start Time: 1535 Appt. End Time: 1615  09/02/2022  [name], identified by name and date of birth, is a 67 y.o. female with a diagnosis of Diabetes: type 2.   ASSESSMENT  Primary concern: pt wants to learn how many carbohydrates she should have and what she should be eating at each meal.    History includes:  anemia, cancer, depression, type 2 diabetes, HTN, HLD.  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 Pt reports on the days she does substitute teaching she does not drink any water until she gets off from work.   Pt reports she has been packing lunch some days but states she has still been eating out a lot.   Pt reports she bought a lot of fresh vegetables but didn't use any before they went bad so she had to throw them away.   Pt states she found out she has arthritis in her knee and was unable to exercise but is now receiving treatment for it and feels ready to exercise again.   Previous Goals: Make 1/2 of plate non-starchy vegetables at least twice daily Increase intake of fruits, vegetables, and whole grains   New Goals: Exercise for 15-30 minutes at least 3 days per week (M, W, F). Aim to eat vegetables at least once per day.  Weight 224 lb (101.6 kg). Body mass index is 35.08 kg/m.   Diabetes Self-Management Education - 09/02/22 1536       Visit Information   Visit Type Follow-up      Health Coping   How would you rate your overall health? Good      Psychosocial Assessment   Patient Belief/Attitude about Diabetes Motivated to manage diabetes    What is the hardest part about your diabetes right now, causing you the most concern, or is the most worrisome to you about your diabetes?    Making healty food and beverage choices    Self-care barriers None    Self-management support Doctor's office    Other persons present Patient    Patient Concerns Nutrition/Meal planning;Healthy Lifestyle    Special Needs None    Preferred Learning Style No preference indicated    Learning Readiness Ready      Pre-Education Assessment   Patient understands the diabetes disease and treatment process. Needs Review    Patient understands incorporating nutritional management into lifestyle. Needs Review    Patient undertands incorporating physical activity into lifestyle. Needs Review    Patient understands using medications safely. Needs Review    Patient understands monitoring blood glucose, interpreting and using results Needs Review    Patient understands prevention, detection, and treatment of acute complications. Needs Review    Patient understands prevention, detection, and treatment of chronic complications. Needs Review    Patient understands how to develop strategies to address psychosocial issues. Needs Review    Patient understands how to develop strategies to promote health/change behavior. Needs Review      Complications   How often do you check your blood sugar? 0 times/day (not testing)      Dietary Intake   Lunch hibachi veggies with chicken and rice OR noodles    Dinner ground beef taco OR baked beans and sausage    Beverage(s)  diet soda,      Activity / Exercise   Activity / Exercise Type ADL's    How many days per week do you exercise? 0    How many minutes per day do you exercise? 0    Total minutes per week of exercise 0      Patient Education   Previous Diabetes Education Yes (please comment)    Disease Pathophysiology Explored patient's options for treatment of their diabetes    Healthy Eating Role of diet in the treatment of diabetes and the relationship between the three main macronutrients and blood glucose level;Plate Method;Meal timing in regards to the  patients' current diabetes medication.;Meal options for control of blood glucose level and chronic complications.    Being Active Role of exercise on diabetes management, blood pressure control and cardiac health.;Identified with patient nutritional and/or medication changes necessary with exercise.;Helped patient identify appropriate exercises in relation to his/her diabetes, diabetes complications and other health issue.    Medications Reviewed patients medication for diabetes, action, purpose, timing of dose and side effects.    Monitoring Identified appropriate SMBG and/or A1C goals.    Chronic complications Relationship between chronic complications and blood glucose control    Diabetes Stress and Support Identified and addressed patients feelings and concerns about diabetes    Lifestyle and Health Coping Lifestyle issues that need to be addressed for better diabetes care      Individualized Goals (developed by patient)   Nutrition General guidelines for healthy choices and portions discussed    Physical Activity Exercise 3-5 times per week;30 minutes per day    Medications take my medication as prescribed    Monitoring  Not Applicable    Problem Solving Eating Pattern    Reducing Risk examine blood glucose patterns;do foot checks daily;treat hypoglycemia with 15 grams of carbs if blood glucose less than 70mg /dL    Health Coping Ask for help with psychological, social, or emotional issues      Patient Self-Evaluation of Goals - Patient rates self as meeting previously set goals (% of time)   Nutrition 25 - 50% (sometimes)    Physical Activity < 25% (hardly ever/never)    Medications >75% (most of the time)    Monitoring Not Applicable    Problem Solving and behavior change strategies  25 - 50% (sometimes)    Reducing Risk (treating acute and chronic complications) 25 - 50% (sometimes)    Health Coping 25 - 50% (sometimes)      Post-Education Assessment   Patient understands the  diabetes disease and treatment process. Comprehends key points    Patient understands incorporating nutritional management into lifestyle. Comprehends key points    Patient undertands incorporating physical activity into lifestyle. Comprehends key points    Patient understands using medications safely. Demonstrates understanding / competency    Patient understands monitoring blood glucose, interpreting and using results Comprehends key points    Patient understands prevention, detection, and treatment of acute complications. Comprehends key points    Patient understands prevention, detection, and treatment of chronic complications. Comprehends key points    Patient understands how to develop strategies to address psychosocial issues. Comprehends key points    Patient understands how to develop strategies to promote health/change behavior. Comprehends key points      Outcomes   Expected Outcomes Demonstrated interest in learning. Expect positive outcomes    Future DMSE 3-4 months    Program Status Not Completed      Subsequent Visit  Since your last visit have you continued or begun to take your medications as prescribed? Yes             Individualized Plan for Diabetes Self-Management Training:   Learning Objective:  Patient will have a greater understanding of diabetes self-management. Patient education plan is to attend individual and/or group sessions per assessed needs and concerns.   Plan:   Patient Instructions  Previous Goals: Make 1/2 of plate non-starchy vegetables at least twice daily Increase intake of fruits, vegetables, and whole grains  New Goals: Exercise for 15-30 minutes at least 3 days per week (M, W, F). Aim to eat vegetables at least once per day.  Expected Outcomes:  Demonstrated interest in learning. Expect positive outcomes  Education material provided: Snack sheet, Non-starchy vs. Starchy vegetables list  If problems or questions, patient to  contact team via:  Phone  Future DSME appointment: 3-4 months

## 2022-09-02 NOTE — Patient Instructions (Addendum)
Previous Goals: Make 1/2 of plate non-starchy vegetables at least twice daily Increase intake of fruits, vegetables, and whole grains  New Goals: Exercise for 15-30 minutes at least 3 days per week (M, W, F). Aim to eat vegetables at least once per day.

## 2022-09-03 DIAGNOSIS — R262 Difficulty in walking, not elsewhere classified: Secondary | ICD-10-CM | POA: Diagnosis not present

## 2022-09-03 DIAGNOSIS — M170 Bilateral primary osteoarthritis of knee: Secondary | ICD-10-CM | POA: Diagnosis not present

## 2022-09-03 DIAGNOSIS — M25561 Pain in right knee: Secondary | ICD-10-CM | POA: Diagnosis not present

## 2022-09-03 DIAGNOSIS — M25562 Pain in left knee: Secondary | ICD-10-CM | POA: Diagnosis not present

## 2022-09-10 DIAGNOSIS — M25561 Pain in right knee: Secondary | ICD-10-CM | POA: Diagnosis not present

## 2022-09-10 DIAGNOSIS — R262 Difficulty in walking, not elsewhere classified: Secondary | ICD-10-CM | POA: Diagnosis not present

## 2022-09-10 DIAGNOSIS — M170 Bilateral primary osteoarthritis of knee: Secondary | ICD-10-CM | POA: Diagnosis not present

## 2022-09-10 DIAGNOSIS — M25562 Pain in left knee: Secondary | ICD-10-CM | POA: Diagnosis not present

## 2022-09-29 DIAGNOSIS — E119 Type 2 diabetes mellitus without complications: Secondary | ICD-10-CM | POA: Diagnosis not present

## 2022-09-29 DIAGNOSIS — I10 Essential (primary) hypertension: Secondary | ICD-10-CM | POA: Diagnosis not present

## 2022-09-29 DIAGNOSIS — R159 Full incontinence of feces: Secondary | ICD-10-CM | POA: Diagnosis not present

## 2022-09-29 DIAGNOSIS — F909 Attention-deficit hyperactivity disorder, unspecified type: Secondary | ICD-10-CM | POA: Diagnosis not present

## 2022-09-29 DIAGNOSIS — N760 Acute vaginitis: Secondary | ICD-10-CM | POA: Diagnosis not present

## 2022-09-29 DIAGNOSIS — E7800 Pure hypercholesterolemia, unspecified: Secondary | ICD-10-CM | POA: Diagnosis not present

## 2022-09-29 DIAGNOSIS — M25569 Pain in unspecified knee: Secondary | ICD-10-CM | POA: Diagnosis not present

## 2022-09-29 DIAGNOSIS — K1120 Sialoadenitis, unspecified: Secondary | ICD-10-CM | POA: Diagnosis not present

## 2022-10-12 ENCOUNTER — Other Ambulatory Visit: Payer: Self-pay | Admitting: Internal Medicine

## 2022-10-12 DIAGNOSIS — K1120 Sialoadenitis, unspecified: Secondary | ICD-10-CM

## 2022-10-21 ENCOUNTER — Encounter: Payer: Self-pay | Admitting: Podiatry

## 2022-10-21 ENCOUNTER — Ambulatory Visit: Payer: Medicare PPO | Admitting: Podiatry

## 2022-10-21 VITALS — BP 145/64

## 2022-10-21 DIAGNOSIS — M79675 Pain in left toe(s): Secondary | ICD-10-CM | POA: Diagnosis not present

## 2022-10-21 DIAGNOSIS — E119 Type 2 diabetes mellitus without complications: Secondary | ICD-10-CM | POA: Diagnosis not present

## 2022-10-21 DIAGNOSIS — L853 Xerosis cutis: Secondary | ICD-10-CM | POA: Diagnosis not present

## 2022-10-21 DIAGNOSIS — L84 Corns and callosities: Secondary | ICD-10-CM | POA: Diagnosis not present

## 2022-10-21 DIAGNOSIS — B351 Tinea unguium: Secondary | ICD-10-CM | POA: Diagnosis not present

## 2022-10-21 DIAGNOSIS — M79674 Pain in right toe(s): Secondary | ICD-10-CM

## 2022-10-21 MED ORDER — AMMONIUM LACTATE 12 % EX CREA: TOPICAL_CREAM | CUTANEOUS | 4 refills | Status: AC

## 2022-10-21 NOTE — Progress Notes (Signed)
  Subjective:  Patient ID: [name], female    DOB: [birth year],  MRN: [id number]  EMELLY GOYAL presents to clinic today for: preventative diabetic foot care and painful elongated mycotic toenails 1-5 bilaterally which are tender when wearing enclosed shoe gear. Pain is relieved with periodic professional debridement.  Chief Complaint  Patient presents with   Nail Problem    DFC,Referring Provider Renford Dills, MD,LOV:05/24,A1C:7.0 per patient,no finger sticks    PCP is Renford Dills, MD.  Allergies  Allergen Reactions   Other Shortness Of Breath and Nausea And Vomiting    Contrast dye- Throat swelling   Multihance [Gadobenate Dimeglumine] Hives, Itching and Nausea Only    Pt had breast mri with multihance, was at first nauseated, clammy and dizzy. Then after about 15 minutes had rash,itching and hives. Treated with 50mg  of benadryl at office and seen by dr. Alfredo Batty   Amoxicillin Other (See Comments)   Amoxicillin [Amoxicillin] Swelling   Gluten Meal    Iodinated Contrast Media Other (See Comments)   Kiwi Extract Nausea And Vomiting and Rash    Review of Systems: Negative except as noted in the HPI.  Objective: No changes noted in today's physical examination. Vitals:   10/21/22 1617 10/21/22 1623  BP: (!) 152/67 (!) 145/64    [name] is a pleasant 67 y.o. female in NAD. AAO x 3.  Vascular Examination: Capillary refill time <3 seconds b/l LE. Palpable pedal pulses b/l LE. Digital hair present b/l. No pedal edema b/l. Skin temperature gradient WNL b/l. No varicosities b/l. Marland Kitchen  Dermatological Examination: Pedal skin with normal turgor and tone b/l. No open wounds. No interdigital macerations b/l. Toenails 1-5 b/l thickened, discolored, dystrophic with subungual debris. There is pain on palpation to dorsal aspect of nailplates. Hyperkeratotic lesion(s) b/l feet and bilateral great toes.  No erythema, no edema, no drainage, no fluctuance. Pedal skin  noted to be dry both feet.  Neurological Examination: Protective sensation intact with 10 gram monofilament b/l LE. Vibratory sensation intact b/l LE.   Musculoskeletal Examination: Normal muscle strength 5/5 to all lower extremity muscle groups bilaterally. HAV with bunion deformity noted b/l LE.Marland Kitchen No pain, crepitus or joint limitation noted with ROM b/l LE.  Patient ambulates independently without assistive aids.  Assessment/Plan: 1. Pain due to onychomycosis of toenails of both feet   2. Callus   3. Xerosis cutis   4. Diabetes mellitus without complication (HCC)     Meds ordered this encounter  Medications   ammonium lactate (LAC-HYDRIN) 12 % cream    Sig: Apply to both feet up to twice daily. Do not apply between toes.    Dispense:  385 g    Refill:  4   -Patient was evaluated and treated. All patient's and/or POA's questions/concerns answered on today's visit. -Consent given for treatment as described below: -Examined patient. -Patient to continue soft, supportive shoe gear daily. -Toenails 1-5 b/l were debrided in length and girth with sterile nail nippers and dremel without iatrogenic bleeding.  -Callus(es) bilateral great toes pared utilizing sterile scalpel blade without complication or incident. Total number debrided =2. -Patient/POA to call should there be question/concern in the interim.   Return in about 3 months (around 01/21/2023).  [name], DPM

## 2022-10-22 ENCOUNTER — Ambulatory Visit
Admission: RE | Admit: 2022-10-22 | Discharge: 2022-10-22 | Disposition: A | Discharge status: Home / Self Care | Payer: Medicare PPO | Source: Ambulatory Visit | Attending: Internal Medicine | Admitting: Internal Medicine

## 2022-10-22 DIAGNOSIS — R221 Localized swelling, mass and lump, neck: Secondary | ICD-10-CM | POA: Diagnosis not present

## 2022-10-22 DIAGNOSIS — K1120 Sialoadenitis, unspecified: Secondary | ICD-10-CM

## 2022-11-01 ENCOUNTER — Other Ambulatory Visit: Payer: Medicare PPO

## 2022-11-10 ENCOUNTER — Ambulatory Visit: Payer: Medicare PPO

## 2022-11-10 ENCOUNTER — Ambulatory Visit
Admission: RE | Admit: 2022-11-10 | Discharge: 2022-11-10 | Disposition: A | Discharge status: Home / Self Care | Payer: Medicare PPO | Source: Ambulatory Visit | Attending: Obstetrics and Gynecology | Admitting: Obstetrics and Gynecology

## 2022-11-10 DIAGNOSIS — R922 Inconclusive mammogram: Secondary | ICD-10-CM | POA: Diagnosis not present

## 2022-11-10 DIAGNOSIS — N6489 Other specified disorders of breast: Secondary | ICD-10-CM

## 2022-11-10 DIAGNOSIS — Z853 Personal history of malignant neoplasm of breast: Secondary | ICD-10-CM | POA: Diagnosis not present

## 2022-11-11 ENCOUNTER — Other Ambulatory Visit: Payer: Medicare PPO

## 2022-12-02 ENCOUNTER — Encounter: Payer: Self-pay | Admitting: Dietician

## 2022-12-02 ENCOUNTER — Encounter: Payer: Medicare PPO | Attending: Internal Medicine | Admitting: Dietician

## 2022-12-02 VITALS — Wt 222.7 lb

## 2022-12-02 DIAGNOSIS — E119 Type 2 diabetes mellitus without complications: Secondary | ICD-10-CM | POA: Insufficient documentation

## 2022-12-02 NOTE — Patient Instructions (Signed)
Previous Goals: Make 1/2 of plate non-starchy vegetables at least twice daily Increase intake of fruits, vegetables, and whole grains Exercise for 15-30 minutes at least 3 days per week (M, W, F). Aim to eat vegetables at least once per day.  New Goals: When snacking, aim to include a carb + protein (15g carbs). At meals, aim to include 1/4 plate protein, 1/4 plate carbs, 1/2 plate non-starchy vegetables.

## 2022-12-02 NOTE — Progress Notes (Signed)
Diabetes Self-Management Education  Visit Type: Follow-up  Appt. Start Time: 1505 Appt. End Time: 1535  12/02/2022  [name], identified by name and date of birth, is a 67 y.o. female with a diagnosis of Diabetes:  .   ASSESSMENT  Primary concern: pt wants to learn how many carbohydrates she should have and what she should be eating at each meal.    History includes:  anemia, cancer, depression, type 2 diabetes, HTN, HLD.  Labs noted: reviewed Medications include: metformin, ozempic Supplements: MVI, omega 3, vitamin D   Anthropometrics Wt 02/19/22: 217 lbs Wt 05/31/22: 225 lbs Wt 09/02/22: 224 lbs Wt 12/02/22: 222.7 lbs Ht: 67in   Pt states her A1c went to 10 and she started ozempic. Pt states she continues to take metformin.   Pt states she refuses to check her blood sugar.  Pt states sometimes in the morning her vision will be blurry and she will drink juice.   Pt states she has been trying to eat more vegetables. She states she has been eating them every other day.  Pt states she has been walking once a week and goes to gym 2 times per week. She does Saturday and Sunday gym days (does a few exercises) tried treadmill but states she put the speed too high and had to stop.    Previous Goals: Make 1/2 of plate non-starchy vegetables at least twice daily Increase intake of fruits, vegetables, and whole grains Exercise for 15-30 minutes at least 3 days per week (M, W, F). Aim to eat vegetables at least once per day.  New Goals: When snacking, aim to include a carb + protein (15g carbs). At meals, aim to include 1/4 plate protein, 1/4 plate carbs, 1/2 plate non-starchy vegetables.  Weight 222 lb 11.2 oz (101 kg). Body mass index is 34.88 kg/m.   Diabetes Self-Management Education - 12/02/22 1504       Visit Information   Visit Type Follow-up      Health Coping   How would you rate your overall health? Good      Psychosocial Assessment   Patient  Belief/Attitude about Diabetes Motivated to manage diabetes    What is the hardest part about your diabetes right now, causing you the most concern, or is the most worrisome to you about your diabetes?   Making healty food and beverage choices    Self-care barriers None    Self-management support Doctor's office    Other persons present Patient    Patient Concerns Nutrition/Meal planning    Special Needs None    Preferred Learning Style No preference indicated    Learning Readiness Ready      Pre-Education Assessment   Patient understands the diabetes disease and treatment process. Needs Review    Patient understands incorporating nutritional management into lifestyle. Needs Review    Patient undertands incorporating physical activity into lifestyle. Needs Review    Patient understands using medications safely. Needs Review    Patient understands monitoring blood glucose, interpreting and using results Needs Review    Patient understands prevention, detection, and treatment of acute complications. Needs Review    Patient understands prevention, detection, and treatment of chronic complications. Needs Review    Patient understands how to develop strategies to address psychosocial issues. Needs Review    Patient understands how to develop strategies to promote health/change behavior. Needs Review      Complications   Last HgB A1C per patient/outside source 9.9 %   per  pt   How often do you check your blood sugar? 0 times/day (not testing)      Dietary Intake   Breakfast bacon and eggs    Snack (morning) cookies and milk    Dinner chicken and vegetables      Activity / Exercise   Activity / Exercise Type ADL's    How many days per week do you exercise? 3    How many minutes per day do you exercise? 15    Total minutes per week of exercise 45      Patient Education   Previous Diabetes Education Yes (please comment)    Disease Pathophysiology Explored patient's options for treatment  of their diabetes    Healthy Eating Food label reading, portion sizes and measuring food.;Role of diet in the treatment of diabetes and the relationship between the three main macronutrients and blood glucose level;Information on hints to eating out and maintain blood glucose control.;Meal options for control of blood glucose level and chronic complications.;Meal timing in regards to the patients' current diabetes medication.;Carbohydrate counting    Being Active Role of exercise on diabetes management, blood pressure control and cardiac health.    Medications Taught/reviewed insulin/injectables, injection, site rotation, insulin/injectables storage and needle disposal.;Reviewed patients medication for diabetes, action, purpose, timing of dose and side effects.    Monitoring Identified appropriate SMBG and/or A1C goals.    Acute complications Taught prevention, symptoms, and  treatment of hypoglycemia - the 15 rule.;Discussed and identified patients' prevention, symptoms, and treatment of hyperglycemia.    Chronic complications Relationship between chronic complications and blood glucose control;Identified and discussed with patient  current chronic complications    Diabetes Stress and Support Identified and addressed patients feelings and concerns about diabetes    Lifestyle and Health Coping Lifestyle issues that need to be addressed for better diabetes care      Individualized Goals (developed by patient)   Nutrition General guidelines for healthy choices and portions discussed    Physical Activity Exercise 3-5 times per week;30 minutes per day    Medications take my medication as prescribed    Monitoring  Test my blood glucose as discussed    Problem Solving Eating Pattern    Reducing Risk examine blood glucose patterns;do foot checks daily;treat hypoglycemia with 15 grams of carbs if blood glucose less than 70mg /dL    Health Coping Ask for help with psychological, social, or emotional issues       Patient Self-Evaluation of Goals - Patient rates self as meeting previously set goals (% of time)   Nutrition 50 - 75 % (half of the time)    Physical Activity 50 - 75 % (half of the time)    Medications >75% (most of the time)    Monitoring < 25% (hardly ever/never)    Problem Solving and behavior change strategies  25 - 50% (sometimes)    Reducing Risk (treating acute and chronic complications) 25 - 50% (sometimes)    Health Coping 25 - 50% (sometimes)      Post-Education Assessment   Patient understands the diabetes disease and treatment process. Comprehends key points    Patient understands incorporating nutritional management into lifestyle. Comprehends key points    Patient undertands incorporating physical activity into lifestyle. Comprehends key points    Patient understands using medications safely. Comphrehends key points    Patient understands monitoring blood glucose, interpreting and using results Comprehends key points    Patient understands prevention, detection, and treatment of acute complications.  Comprehends key points    Patient understands prevention, detection, and treatment of chronic complications. Comprehends key points    Patient understands how to develop strategies to address psychosocial issues. Comprehends key points    Patient understands how to develop strategies to promote health/change behavior. Comprehends key points      Outcomes   Expected Outcomes Demonstrated interest in learning. Expect positive outcomes    Future DMSE 3-4 months    Program Status Not Completed      Subsequent Visit   Since your last visit have you continued or begun to take your medications as prescribed? Yes    Since your last visit have you had your blood pressure checked? No             Individualized Plan for Diabetes Self-Management Training:   Learning Objective:  Patient will have a greater understanding of diabetes self-management. Patient education plan is  to attend individual and/or group sessions per assessed needs and concerns.   Plan:   Patient Instructions  Previous Goals: Make 1/2 of plate non-starchy vegetables at least twice daily Increase intake of fruits, vegetables, and whole grains Exercise for 15-30 minutes at least 3 days per week (M, W, F). Aim to eat vegetables at least once per day.  New Goals: When snacking, aim to include a carb + protein (15g carbs). At meals, aim to include 1/4 plate protein, 1/4 plate carbs, 1/2 plate non-starchy vegetables.  Expected Outcomes:  Demonstrated interest in learning. Expect positive outcomes  Education material provided: ADA - How to Thrive: A Guide for Your Journey with Diabetes, Food label handouts, and Snack sheet  If problems or questions, patient to contact team via:  Phone  Future DSME appointment: 3-4 months

## 2023-01-11 ENCOUNTER — Other Ambulatory Visit: Payer: Self-pay | Admitting: Obstetrics and Gynecology

## 2023-01-11 DIAGNOSIS — M8580 Other specified disorders of bone density and structure, unspecified site: Secondary | ICD-10-CM

## 2023-02-03 ENCOUNTER — Ambulatory Visit: Payer: Medicare Other | Admitting: Podiatry

## 2023-02-17 ENCOUNTER — Ambulatory Visit: Payer: Medicare PPO | Admitting: Dietician

## 2023-06-23 DIAGNOSIS — E7800 Pure hypercholesterolemia, unspecified: Secondary | ICD-10-CM | POA: Diagnosis not present

## 2023-06-23 DIAGNOSIS — I10 Essential (primary) hypertension: Secondary | ICD-10-CM | POA: Diagnosis not present

## 2023-06-23 DIAGNOSIS — Z0000 Encounter for general adult medical examination without abnormal findings: Secondary | ICD-10-CM | POA: Diagnosis not present

## 2023-06-23 DIAGNOSIS — R109 Unspecified abdominal pain: Secondary | ICD-10-CM | POA: Diagnosis not present

## 2023-06-23 DIAGNOSIS — E119 Type 2 diabetes mellitus without complications: Secondary | ICD-10-CM | POA: Diagnosis not present

## 2023-06-23 DIAGNOSIS — Z5181 Encounter for therapeutic drug level monitoring: Secondary | ICD-10-CM | POA: Diagnosis not present

## 2023-06-23 DIAGNOSIS — E1165 Type 2 diabetes mellitus with hyperglycemia: Secondary | ICD-10-CM | POA: Diagnosis not present

## 2023-06-29 DIAGNOSIS — E119 Type 2 diabetes mellitus without complications: Secondary | ICD-10-CM | POA: Diagnosis not present

## 2023-06-30 DIAGNOSIS — C50912 Malignant neoplasm of unspecified site of left female breast: Secondary | ICD-10-CM | POA: Diagnosis not present

## 2023-07-01 DIAGNOSIS — R109 Unspecified abdominal pain: Secondary | ICD-10-CM | POA: Diagnosis not present

## 2023-07-01 DIAGNOSIS — C50912 Malignant neoplasm of unspecified site of left female breast: Secondary | ICD-10-CM | POA: Diagnosis not present

## 2023-07-08 ENCOUNTER — Other Ambulatory Visit: Payer: Self-pay | Admitting: Internal Medicine

## 2023-07-08 DIAGNOSIS — E2839 Other primary ovarian failure: Secondary | ICD-10-CM

## 2023-07-12 DIAGNOSIS — C50912 Malignant neoplasm of unspecified site of left female breast: Secondary | ICD-10-CM | POA: Diagnosis not present

## 2023-08-01 ENCOUNTER — Ambulatory Visit: Payer: Medicare Other | Admitting: Podiatry

## 2023-08-01 ENCOUNTER — Encounter: Payer: Self-pay | Admitting: Podiatry

## 2023-08-01 VITALS — Ht 67.0 in | Wt 222.7 lb

## 2023-08-01 DIAGNOSIS — B351 Tinea unguium: Secondary | ICD-10-CM

## 2023-08-01 DIAGNOSIS — M79674 Pain in right toe(s): Secondary | ICD-10-CM

## 2023-08-01 DIAGNOSIS — E119 Type 2 diabetes mellitus without complications: Secondary | ICD-10-CM

## 2023-08-01 DIAGNOSIS — M2011 Hallux valgus (acquired), right foot: Secondary | ICD-10-CM | POA: Diagnosis not present

## 2023-08-01 DIAGNOSIS — M79675 Pain in left toe(s): Secondary | ICD-10-CM

## 2023-08-01 DIAGNOSIS — L84 Corns and callosities: Secondary | ICD-10-CM | POA: Diagnosis not present

## 2023-08-01 DIAGNOSIS — M2012 Hallux valgus (acquired), left foot: Secondary | ICD-10-CM

## 2023-08-03 DIAGNOSIS — E119 Type 2 diabetes mellitus without complications: Secondary | ICD-10-CM | POA: Diagnosis not present

## 2023-08-04 DIAGNOSIS — J069 Acute upper respiratory infection, unspecified: Secondary | ICD-10-CM | POA: Diagnosis not present

## 2023-08-07 NOTE — Progress Notes (Signed)
 ANNUAL DIABETIC FOOT EXAM  Subjective: [name] presents today for annual diabetic foot exam. Chief Complaint  Patient presents with   Nail Problem    Pt is here for [name] last A1C was 8 PCP is [name] and LOV was in May.   Patient confirms h/o diabetes.  Patient denies any h/o foot wounds.  Tracey Dills, MD is patient's PCP.  Past Medical History:  Diagnosis Date   Abnormal Pap smear 05/25/1991   Anemia    Bladder infection    Breast cancer (HCC)    Breast cancer, left (HCC)    survivor; remission since 2008   Depression 07/16/1991   postpartum depression; non currently;    Diabetes mellitus without complication (HCC)    Fibroids    h/o   H/O hyperlipidemia    H/O menorrhagia    Headache(784.0)    Hypertension    Oligomenorrhea    h/o   Ovarian cyst    h/o asymptomatic   Pain, female pelvic    h/o   Varicose veins    Mother   Patient Active Problem List   Diagnosis Date Noted   Heel callus 03/24/2020   Pain due to onychomycosis of toenails of both feet 05/07/2019   Diabetes mellitus without complication (HCC) 05/07/2019   Malignant neoplasm of overlapping sites of left breast in female, estrogen receptor positive (HCC) 08/12/2016   Ovarian cyst    H/O hyperlipidemia    Pain, female pelvic    Fibroids    Pelvic pain 09/27/2011   S/P endometrial ablation 09/27/2011   Chronic PID (chronic pelvic inflammatory disease) 09/23/2011   Pelvic adhesions 09/23/2011   Spasm of colon 09/23/2011   Past Surgical History:  Procedure Laterality Date   BREAST SURGERY     COLPOSCOPY  06/1991   DIAGNOSTIC LAPAROSCOPY  1994   HYSTEROSCOPY WITH D & C  07/20/2007   lysis of adhesions  Resolved 10/26/90   For pelvic adhesions   MASTECTOMY  2008   left breast   TUBAL LIGATION  1997   Current Outpatient Medications on File Prior to Visit  Medication Sig Dispense Refill   albuterol (VENTOLIN HFA) 108 (90 Base) MCG/ACT inhaler Inhale 1-2 puffs into the lungs  every 6 (six) hours as needed for wheezing or shortness of breath. 1 each 0   ammonium lactate (LAC-HYDRIN) 12 % cream Apply to both feet up to twice daily. Do not apply between toes. 385 g 4   atorvastatin (LIPITOR) 10 MG tablet Take 10 mg by mouth daily.     clotrimazole (LOTRIMIN) 1 % cream Apply to affected area 2 times daily 35 g 0   diclofenac Sodium (VOLTAREN) 1 % GEL Apply 2 g topically 4 (four) times daily. 50 g 0   fesoterodine (TOVIAZ) 8 MG TB24 tablet Take by mouth.     fluticasone (FLONASE) 50 MCG/ACT nasal spray Place 1 spray into both nostrils daily. Begin by using 2 sprays in each nare daily for 3 to 5 days, then decrease to 1 spray in each nare daily. 15.8 mL 2   furosemide (LASIX) 20 MG tablet furosemide 20 mg tablet     lidocaine (XYLOCAINE) 5 % ointment Apply 1 Application topically 4 (four) times daily as needed. 35.44 g 0   losartan (COZAAR) 25 MG tablet 1 tablet     metFORMIN (GLUCOPHAGE-XR) 750 MG 24 hr tablet 1 tablet     metroNIDAZOLE (METROGEL) 1 % gel Apply topically daily. 60 g 0  Multiple Vitamin (MULTI-VITAMIN) tablet Take 1 tablet by mouth daily.     Omega-3 Fatty Acids (OMEGA-3 FISH OIL PO) Omega 3 Fish Oil     promethazine-dextromethorphan (PROMETHAZINE-DM) 6.25-15 MG/5ML syrup Take 5 mLs by mouth at bedtime as needed for cough. 60 mL 0   tolterodine (DETROL LA) 4 MG 24 hr capsule 1 capsule     VITAMIN D PO 5,000 unit capsules     cetirizine (ZYRTEC ALLERGY) 10 MG tablet Take 1 tablet (10 mg total) by mouth at bedtime. 90 tablet 1   doxycycline (VIBRAMYCIN) 100 MG capsule Take 1 capsule (100 mg total) by mouth 2 (two) times daily. (Patient not taking: Reported on 08/01/2023) 20 capsule 0   Current Facility-Administered Medications on File Prior to Visit  Medication Dose Route Frequency Provider Last Rate Last Admin   levonorgestrel (MIRENA) 20 MCG/24HR IUD   Intrauterine Once [name], Tracey Berger, MD        Allergies  Allergen Reactions   Other Shortness Of  Breath and Nausea And Vomiting    Contrast dye- Throat swelling   Multihance [Gadobenate Dimeglumine] Hives, Itching and Nausea Only    Pt had breast mri with multihance, was at first nauseated, clammy and dizzy. Then after about 15 minutes had rash,itching and hives. Treated with 50mg  of benadryl at office and seen by Tracey. Alfredo Hahn   Amoxicillin Other (See Comments)   Amoxicillin [Amoxicillin] Swelling   Gluten Meal    Iodinated Contrast Media Other (See Comments)   Kiwi Extract Nausea And Vomiting and Rash   Social History   Occupational History   Not on file  Tobacco Use   Smoking status: Former    Current packs/day: 0.00    Types: Cigarettes    Quit date: 06/08/1981    Years since quitting: 42.1   Smokeless tobacco: Never  Vaping Use   Vaping status: Never Used  Substance and Sexual Activity   Alcohol use: Not Currently    Comment: Rare   Drug use: No   Sexual activity: Yes    Birth control/protection: Post-menopausal   Family History  Problem Relation Age of Onset   Diabetes Sister    Hypertension Sister    Breast cancer Maternal Aunt    Breast cancer Maternal Aunt    Cancer Other    Hypertension Other    Diabetes Other    Immunization History  Administered Date(s) Administered   Influenza,inj,Quad PF,6+ Mos 02/19/2016   PPD Test 06/20/2015   Tdap 09/27/2019     Review of Systems: Negative except as noted in the HPI.   Objective: There were no vitals filed for this visit.  [name] is a pleasant 68 y.o. female in NAD. AAO X 3.  Diabetic foot exam was performed with the following findings:   Vascular Examination: Capillary refill time immediate b/l. Vascular status intact b/l with palpable pedal pulses. Pedal hair present b/l. No pain with calf compression b/l. Skin temperature gradient WNL b/l. No cyanosis or clubbing b/l. No ischemia or gangrene noted b/l. No varicosities noted.  Neurological Examination: Sensation grossly intact b/l with 10  gram monofilament. Vibratory sensation intact b/l.   Dermatological Examination: Pedal skin with normal turgor, texture and tone b/l.  No open wounds. No interdigital macerations.   Toenails 1-5 b/l thick, discolored, elongated with subungual debris and pain on dorsal palpation.   Hyperkeratotic lesion(s) submet head 5 b/l.  No erythema, no edema, no drainage, no fluctuance.  Musculoskeletal Examination: Muscle strength 5/5 to  all lower extremity muscle groups bilaterally. No pain, crepitus or joint limitation noted with ROM bilateral LE. HAV with bunion deformity noted b/l LE.  Radiographs: None    ADA Risk Categorization: Low Risk :  Patient has all of the following: Intact protective sensation No prior foot ulcer  No severe deformity Pedal pulses present  Assessment: 1. Pain due to onychomycosis of toenails of both feet   2. Callus   3. Hallux valgus, acquired, bilateral   4. Diabetes mellitus without complication (HCC)   5. Encounter for diabetic foot exam [facility])     Plan: Consent given for treatment. Diabetic foot examination performed. All patient's and/or POA's questions/concerns addressed on today's visit. Mycotic toenails 1-5 debrided in length and girth without incident. Callus(es) submet head 5 b/l pared with sharp debridement without incident. Continue foot and shoe inspections daily. Monitor blood glucose per PCP/Endocrinologist's recommendations.Continue soft, supportive shoe gear daily. Report any pedal injuries to medical professional. Call office if there are any quesitons/concerns. -Patient/POA to call should there be question/concern in the interim. Return in about 3 months (around 11/01/2023).  Freddie Breech, DPM      [facility] LOCATION: [address], [place] [address]                   Office [phone]   [facility] LOCATION: [address] [place], [place] [address] Office [phone]

## 2023-08-11 ENCOUNTER — Ambulatory Visit: Payer: Self-pay | Admitting: Dietician

## 2023-09-07 DIAGNOSIS — E119 Type 2 diabetes mellitus without complications: Secondary | ICD-10-CM | POA: Diagnosis not present

## 2023-09-15 DIAGNOSIS — E7800 Pure hypercholesterolemia, unspecified: Secondary | ICD-10-CM | POA: Diagnosis not present

## 2023-09-15 DIAGNOSIS — E1165 Type 2 diabetes mellitus with hyperglycemia: Secondary | ICD-10-CM | POA: Diagnosis not present

## 2023-09-15 DIAGNOSIS — I10 Essential (primary) hypertension: Secondary | ICD-10-CM | POA: Diagnosis not present

## 2023-10-03 DIAGNOSIS — E119 Type 2 diabetes mellitus without complications: Secondary | ICD-10-CM | POA: Diagnosis not present

## 2023-10-12 DIAGNOSIS — R350 Frequency of micturition: Secondary | ICD-10-CM | POA: Diagnosis not present

## 2023-10-19 DIAGNOSIS — E119 Type 2 diabetes mellitus without complications: Secondary | ICD-10-CM | POA: Diagnosis not present

## 2023-10-19 DIAGNOSIS — H25813 Combined forms of age-related cataract, bilateral: Secondary | ICD-10-CM | POA: Diagnosis not present

## 2023-10-19 DIAGNOSIS — H524 Presbyopia: Secondary | ICD-10-CM | POA: Diagnosis not present

## 2023-10-19 DIAGNOSIS — H53143 Visual discomfort, bilateral: Secondary | ICD-10-CM | POA: Diagnosis not present

## 2023-10-19 DIAGNOSIS — H40013 Open angle with borderline findings, low risk, bilateral: Secondary | ICD-10-CM | POA: Diagnosis not present

## 2023-11-10 DIAGNOSIS — R9389 Abnormal findings on diagnostic imaging of other specified body structures: Secondary | ICD-10-CM | POA: Diagnosis not present

## 2023-11-17 DIAGNOSIS — R9389 Abnormal findings on diagnostic imaging of other specified body structures: Secondary | ICD-10-CM | POA: Diagnosis not present

## 2023-11-30 DIAGNOSIS — R350 Frequency of micturition: Secondary | ICD-10-CM | POA: Diagnosis not present

## 2023-12-05 ENCOUNTER — Other Ambulatory Visit: Payer: Self-pay | Admitting: Obstetrics and Gynecology

## 2023-12-08 DIAGNOSIS — E119 Type 2 diabetes mellitus without complications: Secondary | ICD-10-CM | POA: Diagnosis not present

## 2023-12-13 NOTE — H&P (Addendum)
 [name] is a 68 y.o. female, P: 2-0-1-2 who presents for hysteroscopy, dilatation and curettage because of postmenopausal bleeding and a thickened endometrium.  During an exam by her Urologist for overactive bladder symptoms the patient was told that she had blood on top of her uterus.   A follow up ultrasound revealed: an anteverted uterus, volume =144 cc; 8.90 x 5.82 x 5.30 cm, endometrium: 11.91 mm with thickened area at fundus; an anterior intramural fibroid displacing the endometrial cavity-3.71 cm; right ovary-1.78 cm and left ovary-2.94 cm.  An endometrial biopsy was attempted however, the patient's cervix was stenotic. The patient denies any vaginal bleeding pelvic pain or  vaginitis symptoms.  Given the findings on ultrasound and cervical stenosis the patient has consented to proceed with a hysteroscopy, dilatation and curettage to further evaluate and sample  endometrial thickening.  Past Medical History  OB History: G: 2; P: 2-0-1-2  GYN History: menarche:68 yo;    LMP: menopausal;  Denies history of abnormal PAP smear.  Last PAP smear: 2024 normal with negative HPV  Medical History: Left Breast Cancer, Diabetes Mellitus, Uterine Fibroids, Hypertension, Vitamin D  Deficiency, Herpes Simplex 1 & 2, Gluten Sensitivity and Overactive Bladder  Surgical History:  1997 Diagnostic Laparoscopy; 1997 Tubal Sterilization; 2008 Left Mastectomy and 2009 Hysteroscopy Dilatation and Curettage   Family History: Breast Cancer, Diabetes Mellitus and Hypertension  Social History:   Single and former smoker.    Medications: Oxybutyinin  ER 10 mg daily Ozempic 1 mg/dose sub-Q weekly Trospium 20 mg twice a day Fesoterodine ER 8 mg daily Lasix 20 mg daily Losartan 25 mg daily Metformin 750 mg ER daily  Allergies  Allergen Reactions   Other Shortness Of Breath and Nausea And Vomiting    Contrast dye- Throat swelling   Multihance  [Gadobenate Dimeglumine ] Hives, Itching and Nausea Only     Pt had breast mri with multihance , was at first nauseated, clammy and dizzy. Then after about 15 minutes had rash,itching and hives. Treated with 50mg  of benadryl  at office and seen by dr. shoshana   Amoxicillin Other (See Comments)   Amoxicillin [Amoxicillin] Swelling   Gluten Meal    Iodinated Contrast Media Other (See Comments)   Kiwi Extract Nausea And Vomiting and Rash    ROS:  Denies headache, vision changes, nasal congestion, dysphagia, tinnitus, dizziness, hoarseness, cough,  chest pain, shortness of breath, nausea, vomiting, diarrhea,constipation,  urinary frequency, urgency  dysuria, hematuria, vaginitis symptoms, pelvic pain, swelling of joints,easy bruising,  myalgias, arthralgias, skin rashes, unexplained weight loss and except as is mentioned in the history of present illness, patient's review of systems is otherwise negative.    Physical Exam  bp: 122/82;  Weight: 213 lbs.; Height: 5'4;  BMI: 36.6  Neck: supple without masses or thyromegaly Lungs: clear to auscultation Heart: regular rate and rhythm Abdomen: soft, non-tender and no organomegaly Pelvic:EGBUS- wnl; vagina-normal rugae; uterus-normal size, cervix without lesions or motion tenderness; adnexae-no tenderness or masses Extremities:  no clubbing, cyanosis or edema   Assesment: Postmenopausal Bleeding                      Thickened Endometrium   Disposition:  A discussion was held with patient regarding the indication for her procedure(s) along with the risks, which include but are not limited to: reaction to anesthesia, damage to adjacent organs, ( to include bladder and uterine perforation),  infection and excessive bleeding. The patient verbalized understanding of these risks and has consented to proceed  with Hysteroscopy, Dilatation and Curettage at [facility] on December 23, 2023.   CSN# [id number]   Duey Liller J. Perri, PA-C  for Dr. Nena LABOR. [place].

## 2023-12-15 ENCOUNTER — Encounter (HOSPITAL_COMMUNITY): Payer: Self-pay | Admitting: Obstetrics and Gynecology

## 2023-12-15 NOTE — Progress Notes (Signed)
 Spoke w/ via phone for pre-op interview--- pt Lab needs dos----     cbc/ bmp/ t&s / A1c/ ekg     Lab results------ no COVID test -----patient states asymptomatic no test needed Arrive at ------- 1230 on 12-22-2023 NPO after MN NO Solid Food.  Clear liquids from MN until--- 1130 Pre-Surgery Ensure or G2: n/a  Med rec completed Medications to take morning of surgery ----- oxybutynin Diabetic medication ----- do not take metformin morning of surgery  GLP1 agonist last dose: 12-10-2023 GLP1 instructions: pt was given instructions not to do next injection and not to do until after surgery since she was not given instructions from office but looks like per H&P they did not know she was on ozempic  Patient instructed no nail polish to be worn day of surgery Patient instructed to bring photo id and insurance card day of surgery Patient aware to have Driver (ride ) / caregiver    for 24 hours after surgery - husband, Press photographer ,  patient stated he does not have cell phone .  He really does not want to stay but the plan is for him to stay and when pre-op done he comes to pre-op he can get an estimate time to return to pick wife up at discharge  Patient Special Instructions ----- soap shower morning of surgery and she will bring urinary pads Pre-Op special Instructions ----- pt had stated the reason she did not answer earlier today because her phone showed the number as spam, she apologized.  Patient verbalized understanding of instructions that were given at this phone interview. Patient denies chest pain, sob, fever, cough at the interview.

## 2023-12-15 NOTE — Progress Notes (Signed)
 Calling patient today to complete pre-op interview for surgery scheduled on 12-22-2023 by Dr Darcel.  PAT nurse had LVM previously on 12/13/2023 but had not returned call.  When I tried calling twice the call was picked up but was immediately hung up on.  Waited a few more minutes and tried again,  this time the person (unsure if patient since unable to identity) stated please don't call anymore thank you, click.  Was unable to speak a word.  Unsuccessful outreach to patient today.  Called Lysle Monte PA for Dr Darcel informed her of issue with contacting patient she stated will get in touch with patient let know we are contacting her to complete pre-op over the phone for surgery , number given for patient to call at her convenience.

## 2023-12-21 DIAGNOSIS — E1165 Type 2 diabetes mellitus with hyperglycemia: Secondary | ICD-10-CM | POA: Diagnosis not present

## 2023-12-21 DIAGNOSIS — I10 Essential (primary) hypertension: Secondary | ICD-10-CM | POA: Diagnosis not present

## 2023-12-21 DIAGNOSIS — E7800 Pure hypercholesterolemia, unspecified: Secondary | ICD-10-CM | POA: Diagnosis not present

## 2023-12-21 NOTE — Progress Notes (Signed)
 Patient informed to stop all liquids (and be finished) by 0950, and nothing to eat after MN. Patient to arrive now at 1050 due to schedule change. Patient verbalizes understanding, and will take prescribed pill (prescribed by Dr. Darcel) as soon as she wakes up in the morning.

## 2023-12-22 ENCOUNTER — Encounter (HOSPITAL_COMMUNITY): Payer: Self-pay | Admitting: Obstetrics and Gynecology

## 2023-12-22 ENCOUNTER — Ambulatory Visit (HOSPITAL_BASED_OUTPATIENT_CLINIC_OR_DEPARTMENT_OTHER)

## 2023-12-22 ENCOUNTER — Other Ambulatory Visit: Payer: Self-pay

## 2023-12-22 ENCOUNTER — Ambulatory Visit (HOSPITAL_COMMUNITY)

## 2023-12-22 ENCOUNTER — Encounter (HOSPITAL_COMMUNITY)
Admission: RE | Disposition: A | Discharge status: Home / Self Care | Payer: Self-pay | Source: Home / Self Care | Attending: Obstetrics and Gynecology

## 2023-12-22 ENCOUNTER — Ambulatory Visit (HOSPITAL_COMMUNITY)
Admission: RE | Admit: 2023-12-22 | Discharge: 2023-12-22 | Disposition: A | Discharge status: Home / Self Care | Attending: Obstetrics and Gynecology | Admitting: Obstetrics and Gynecology

## 2023-12-22 DIAGNOSIS — N950 Postmenopausal bleeding: Secondary | ICD-10-CM | POA: Diagnosis present

## 2023-12-22 DIAGNOSIS — R9389 Abnormal findings on diagnostic imaging of other specified body structures: Secondary | ICD-10-CM | POA: Diagnosis not present

## 2023-12-22 DIAGNOSIS — Z853 Personal history of malignant neoplasm of breast: Secondary | ICD-10-CM | POA: Diagnosis not present

## 2023-12-22 DIAGNOSIS — Z7984 Long term (current) use of oral hypoglycemic drugs: Secondary | ICD-10-CM | POA: Diagnosis not present

## 2023-12-22 DIAGNOSIS — I10 Essential (primary) hypertension: Secondary | ICD-10-CM

## 2023-12-22 DIAGNOSIS — Z87891 Personal history of nicotine dependence: Secondary | ICD-10-CM | POA: Diagnosis not present

## 2023-12-22 DIAGNOSIS — Z01818 Encounter for other preprocedural examination: Secondary | ICD-10-CM

## 2023-12-22 DIAGNOSIS — N3281 Overactive bladder: Secondary | ICD-10-CM | POA: Diagnosis not present

## 2023-12-22 DIAGNOSIS — E119 Type 2 diabetes mellitus without complications: Secondary | ICD-10-CM | POA: Insufficient documentation

## 2023-12-22 DIAGNOSIS — N3946 Mixed incontinence: Secondary | ICD-10-CM | POA: Insufficient documentation

## 2023-12-22 HISTORY — DX: Type 2 diabetes mellitus without complications: E11.9

## 2023-12-22 HISTORY — DX: Postmenopausal bleeding: N95.0

## 2023-12-22 HISTORY — DX: Localized edema: R60.0

## 2023-12-22 HISTORY — DX: Personal history of other diseases of the female genital tract: Z87.42

## 2023-12-22 HISTORY — DX: Hyperlipidemia, unspecified: E78.5

## 2023-12-22 HISTORY — DX: Other constipation: K59.09

## 2023-12-22 HISTORY — DX: Presence of spectacles and contact lenses: Z97.3

## 2023-12-22 HISTORY — DX: Leiomyoma of uterus, unspecified: D25.9

## 2023-12-22 HISTORY — DX: Dyspnea, unspecified: R06.00

## 2023-12-22 HISTORY — DX: Abnormal findings on diagnostic imaging of other specified body structures: R93.89

## 2023-12-22 HISTORY — DX: Overactive bladder: N32.81

## 2023-12-22 HISTORY — PX: HYSTEROSCOPY WITH D & C: SHX1775

## 2023-12-22 HISTORY — DX: Insomnia, unspecified: G47.00

## 2023-12-22 HISTORY — DX: Mixed incontinence: N39.46

## 2023-12-22 LAB — BASIC METABOLIC PANEL WITH GFR
Anion gap: 11 (ref 5–15)
BUN: 12 mg/dL (ref 8–23)
CO2: 23 mmol/L (ref 22–32)
Calcium: 9.5 mg/dL (ref 8.9–10.3)
Chloride: 104 mmol/L (ref 98–111)
Creatinine, Ser: 0.75 mg/dL (ref 0.44–1.00)
GFR, Estimated: >60 mL/min (ref >60)
Glucose, Bld: 111 mg/dL — ABNORMAL HIGH (ref 70–99)
Potassium: 4.3 mmol/L (ref 3.5–5.1)
Sodium: 138 mmol/L (ref 135–145)

## 2023-12-22 LAB — CBC
HCT: 37.9 % (ref 36.0–46.0)
Hemoglobin: 12.1 g/dL (ref 12.0–15.0)
MCH: 27.8 pg (ref 26.0–34.0)
MCHC: 31.9 g/dL (ref 30.0–36.0)
MCV: 86.9 fL (ref 80.0–100.0)
Platelets: 162 K/uL (ref 150–400)
RBC: 4.36 MIL/uL (ref 3.87–5.11)
RDW: 14.1 % (ref 11.5–15.5)
WBC: 5.7 K/uL (ref 4.0–10.5)
nRBC: 0 % (ref 0.0–0.2)

## 2023-12-22 LAB — HEMOGLOBIN A1C
Hgb A1c MFr Bld: 6.2 % — ABNORMAL HIGH (ref 4.8–5.6)
Mean Plasma Glucose: 131 mg/dL

## 2023-12-22 LAB — ABO/RH: ABO/RH(D): O POS

## 2023-12-22 LAB — GLUCOSE, CAPILLARY
Glucose-Capillary: 105 mg/dL — ABNORMAL HIGH (ref 70–99)
Glucose-Capillary: 120 mg/dL — ABNORMAL HIGH (ref 70–99)

## 2023-12-22 LAB — TYPE AND SCREEN
ABO/RH(D): O POS
Antibody Screen: NEGATIVE

## 2023-12-22 SURGERY — DILATATION AND CURETTAGE /HYSTEROSCOPY
Anesthesia: Monitor Anesthesia Care

## 2023-12-22 MED ORDER — ACETAMINOPHEN 500 MG PO TABS
ORAL_TABLET | ORAL | Status: AC
  Filled 2023-12-22: qty 2

## 2023-12-22 MED ORDER — LACTATED RINGERS IV SOLN: INTRAVENOUS | Status: DC

## 2023-12-22 MED ORDER — FENTANYL CITRATE (PF) 250 MCG/5ML IJ SOLN
INTRAMUSCULAR | Status: DC | PRN
  Administered 2023-12-22 (×3): 50 ug via INTRAVENOUS

## 2023-12-22 MED ORDER — CELECOXIB 200 MG PO CAPS
400.0000 mg | ORAL_CAPSULE | ORAL | Status: AC
  Administered 2023-12-22: 400 mg via ORAL

## 2023-12-22 MED ORDER — ONDANSETRON HCL 4 MG/2ML IJ SOLN
INTRAMUSCULAR | Status: DC | PRN
  Administered 2023-12-22: 4 mg via INTRAVENOUS

## 2023-12-22 MED ORDER — CHLOROPROCAINE HCL 1 % IJ SOLN
INTRAMUSCULAR | Status: AC
  Filled 2023-12-22: qty 30

## 2023-12-22 MED ORDER — POVIDONE-IODINE 10 % EX SWAB: 2.0000 | Freq: Once | CUTANEOUS | Status: DC

## 2023-12-22 MED ORDER — CHLORHEXIDINE GLUCONATE 0.12 % MT SOLN
OROMUCOSAL | Status: AC
  Filled 2023-12-22: qty 15

## 2023-12-22 MED ORDER — CHLOROPROCAINE HCL 1 % IJ SOLN
INTRAMUSCULAR | Status: DC | PRN
  Administered 2023-12-22: 10 mL

## 2023-12-22 MED ORDER — ENSURE PRE-SURGERY PO LIQD: 296.0000 mL | Freq: Once | ORAL | Status: DC

## 2023-12-22 MED ORDER — MIDAZOLAM HCL 2 MG/2ML IJ SOLN
INTRAMUSCULAR | Status: DC | PRN
  Administered 2023-12-22: 2 mg via INTRAVENOUS

## 2023-12-22 MED ORDER — LIDOCAINE 2% (20 MG/ML) 5 ML SYRINGE
INTRAMUSCULAR | Status: DC | PRN
  Administered 2023-12-22: 40 mg via INTRAVENOUS

## 2023-12-22 MED ORDER — MIDAZOLAM HCL 2 MG/2ML IJ SOLN
INTRAMUSCULAR | Status: AC
Start: 2023-12-22 — End: 2023-12-22
  Filled 2023-12-22: qty 2

## 2023-12-22 MED ORDER — ORAL CARE MOUTH RINSE: 15.0000 mL | Freq: Once | OROMUCOSAL | Status: AC

## 2023-12-22 MED ORDER — GABAPENTIN 300 MG PO CAPS
ORAL_CAPSULE | ORAL | Status: AC
  Filled 2023-12-22: qty 1

## 2023-12-22 MED ORDER — ACETAMINOPHEN 500 MG PO TABS
1000.0000 mg | ORAL_TABLET | ORAL | Status: AC
  Administered 2023-12-22: 1000 mg via ORAL

## 2023-12-22 MED ORDER — SCOPOLAMINE 1 MG/3DAYS TD PT72: 1.0000 | MEDICATED_PATCH | TRANSDERMAL | Status: DC

## 2023-12-22 MED ORDER — PROPOFOL 500 MG/50ML IV EMUL
INTRAVENOUS | Status: DC | PRN
  Administered 2023-12-22: 100 ug/kg/min via INTRAVENOUS

## 2023-12-22 MED ORDER — CELECOXIB 200 MG PO CAPS
ORAL_CAPSULE | ORAL | Status: AC
  Filled 2023-12-22: qty 2

## 2023-12-22 MED ORDER — SODIUM CHLORIDE 0.9 % IR SOLN
Status: DC | PRN
  Administered 2023-12-22: 3000 mL

## 2023-12-22 MED ORDER — GABAPENTIN 300 MG PO CAPS
300.0000 mg | ORAL_CAPSULE | ORAL | Status: AC
  Administered 2023-12-22: 300 mg via ORAL

## 2023-12-22 MED ORDER — FENTANYL CITRATE (PF) 250 MCG/5ML IJ SOLN
INTRAMUSCULAR | Status: AC
  Filled 2023-12-22: qty 5

## 2023-12-22 MED ORDER — SCOPOLAMINE 1 MG/3DAYS TD PT72
MEDICATED_PATCH | TRANSDERMAL | Status: AC
  Filled 2023-12-22: qty 1

## 2023-12-22 MED ORDER — CHLORHEXIDINE GLUCONATE 0.12 % MT SOLN
15.0000 mL | Freq: Once | OROMUCOSAL | Status: AC
  Administered 2023-12-22: 15 mL via OROMUCOSAL

## 2023-12-22 MED ORDER — PROPOFOL 10 MG/ML IV BOLUS
INTRAVENOUS | Status: DC | PRN
  Administered 2023-12-22 (×2): 20 mg via INTRAVENOUS
  Administered 2023-12-22: 30 mg via INTRAVENOUS
  Administered 2023-12-22: 20 mg via INTRAVENOUS
  Administered 2023-12-22: 50 mg via INTRAVENOUS

## 2023-12-22 SURGICAL SUPPLY — 17 items
CATH ROBINSON RED A/P 16FR (CATHETERS) IMPLANT
COVER MAYO STAND STRL (DRAPES) ×2 IMPLANT
DEVICE MYOSURE LITE (MISCELLANEOUS) IMPLANT
DEVICE MYOSURE REACH (MISCELLANEOUS) IMPLANT
DILATOR CANAL MILEX (MISCELLANEOUS) ×2 IMPLANT
GLOVE ECLIPSE 6.5 STRL STRAW (GLOVE) ×2 IMPLANT
GLOVE SURG UNDER POLY LF SZ7 (GLOVE) ×4 IMPLANT
GOWN STRL REUS W/ TWL LRG LVL3 (GOWN DISPOSABLE) ×2 IMPLANT
KIT PROCEDURE FLUENT (KITS) ×2 IMPLANT
KIT TURNOVER KIT B (KITS) ×2 IMPLANT
NDL ASPIRATION 22 (NEEDLE) ×2 IMPLANT
NEEDLE ASPIRATION 22 (NEEDLE) ×1 IMPLANT
PACK VAGINAL MINOR WOMEN LF (CUSTOM PROCEDURE TRAY) ×2 IMPLANT
PAD OB MATERNITY 11 LF (PERSONAL CARE ITEMS) ×2 IMPLANT
SEAL ROD LENS SCOPE MYOSURE (ABLATOR) ×2 IMPLANT
TOWEL GREEN STERILE FF (TOWEL DISPOSABLE) ×2 IMPLANT
UNDERPAD 30X36 HEAVY ABSORB (UNDERPADS AND DIAPERS) ×2 IMPLANT

## 2023-12-22 NOTE — Anesthesia Preprocedure Evaluation (Addendum)
 Anesthesia Evaluation  Patient identified by MRN, date of birth, ID band Patient awake    Reviewed: Allergy & Precautions, NPO status , Patient's Chart, lab work & pertinent test results  Airway Mallampati: II  TM Distance: >3 FB Neck ROM: Full    Dental no notable dental hx.    Pulmonary former smoker   Pulmonary exam normal        Cardiovascular hypertension, Pt. on medications Normal cardiovascular exam     Neuro/Psych  Headaches    GI/Hepatic negative GI ROS, Neg liver ROS,,,  Endo/Other  diabetes, Oral Hypoglycemic Agents    Renal/GU negative Renal ROS     Musculoskeletal negative musculoskeletal ROS (+)    Abdominal  (+) + obese  Peds  Hematology negative hematology ROS (+)   Anesthesia Other Findings POSTMENOPAUSAL BLEEDING THICKENED ENDOMETRIUM  Reproductive/Obstetrics                              Anesthesia Physical Anesthesia Plan  ASA: 3  Anesthesia Plan: MAC   Post-op Pain Management:    Induction:   PONV Risk Score and Plan: 2 and Ondansetron , Dexamethasone, Propofol  infusion, Midazolam  and Treatment may vary due to age or medical condition  Airway Management Planned: Simple Face Mask  Additional Equipment:   Intra-op Plan:   Post-operative Plan:   Informed Consent: I have reviewed the patients History and Physical, chart, labs and discussed the procedure including the risks, benefits and alternatives for the proposed anesthesia with the patient or authorized representative who has indicated his/her understanding and acceptance.     Dental advisory given  Plan Discussed with: CRNA  Anesthesia Plan Comments:         Anesthesia Quick Evaluation

## 2023-12-22 NOTE — Transfer of Care (Signed)
 Immediate Anesthesia Transfer of Care Note  Patient: [name]  Procedure(s) Performed: DILATATION AND CURETTAGE /HYSTEROSCOPY  Patient Location: PACU  Anesthesia Type:MAC  Level of Consciousness: awake, alert , and patient cooperative  Airway & Oxygen Therapy: Patient Spontanous Breathing and Patient connected to face mask oxygen  Post-op Assessment: Report given to RN and Post -op Vital signs reviewed and stable  Post vital signs: Reviewed and stable  Last Vitals:  Vitals Value Taken Time  BP 117/54 12/22/23 14:25  Temp 36.5 C 12/22/23 14:25  Pulse 67 12/22/23 14:26  Resp 20 12/22/23 14:26  SpO2 98 % 12/22/23 14:26  Vitals shown include unfiled device data.  Last Pain:  Vitals:   12/22/23 1048  TempSrc: Oral  PainSc: 0-No pain      Patients Stated Pain Goal: 5 (12/22/23 1048)  Complications: No notable events documented.

## 2023-12-22 NOTE — Op Note (Signed)
 Preop diagnosis: post menopausal bleeding with thickened endometrium  Postop diagnosis: same  Anesthesia: IV sedation  Anesthesiologist: Dr. Patrisha  Procedure: Hysteroscopy with dilatation and curettage  Surgeon: Dr. Nena Livvy Spilman  Procedure: After being informed of the planned procedure with possible complications including bleeding, infection and uterine perforation, informed consent was obtained and patient was taken to or #7.  She was given IV sedation anesthesia without complication. She was placed in a dorsal decubitus position, prepped and draped in the sterile fashion and she had voided. Pelvic exam reveals anteverted normal size uterus without any pelvic mass..  A speculum is inserted in the vagina. The cervix was grasped with a tenaculum forcep placed on the anterior lip.We proceed with a paracervical block using 1% Nesacaine , 10 cc. Uterus is sounded at 7 cm. The cervix is then easily dilated using Hegar dilator until # 25. This allows for easy placement of a diagnostic hysteroscope. With perfusion of Normal Saline at a maximum pressure of 80 mmHg, we are able to evaluate the entire uterine cavity.  Observation: the uterine cavity is very small with atrophic endometrium  We then removed our instrumentation. Using a sharp curette, we proceed with curettage of the endometrial cavity which returns a very small amount of normal-appearing endometrium.  Instruments are then removed. Instrument and sponge count is complete x2. Estimated blood loss is minimal. Water deficit is 200 cc of Normal Saline.  The procedure is very well tolerated by the patient who is taken to recovery room in a well and stable condition.  Specimen: Endometrial curettings sent to pathology.

## 2023-12-22 NOTE — Discharge Instructions (Addendum)
 POST-OPERATIVE INSTRUCTIONS TO PATIENT  Call [facility] at [phone]  for excessive pain, bleeding or temperature greater than or equal to 100.4 degrees (orally).    No driving for 24 hours  No sexual activity for 1 week  Pain management:Use Ibuprofen  600 mg or Acetaminophen  1000 mg every 6 hours as needed.       Diet: normal  Bathing: may shower day after surgery  Return to Dr. Darcel on 8//21/25 at 2:30 pm    Nena Darcel MD  Post Anesthesia Home Care Instructions  Activity: Get plenty of rest for the remainder of the day. A responsible individual must stay with you for 24 hours following the procedure.  For the next 24 hours, DO NOT: -Drive a car -Advertising copywriter -Drink alcoholic beverages -Take any medication unless instructed by your physician -Make any legal decisions or sign important papers.  Meals: Start with liquid foods such as gelatin or soup. Progress to regular foods as tolerated. Avoid greasy, spicy, heavy foods. If nausea and/or vomiting occur, drink only clear liquids until the nausea and/or vomiting subsides. Call your physician if vomiting continues.  Special Instructions/Symptoms: Your throat may feel dry or sore from the anesthesia or the breathing tube placed in your throat during surgery. If this causes discomfort, gargle with warm salt water. The discomfort should disappear within 24 hours.  Take Tylenol  beginning at 5:15 PM as needed for soreness/cramping. Take Ibuprofen  beginning at 7:15 PM as needed for soreness/cramping.

## 2023-12-22 NOTE — Interval H&P Note (Signed)
 History and Physical Interval Note:  12/22/2023 11:57 AM  [name]  has presented today for surgery, with the diagnosis of POSTMENOPAUSAL BLEEDING, THICKENED ENDOMETRIUM.  The various methods of treatment have been discussed with the patient and family. After consideration of risks, benefits and other options for treatment, the patient has consented to  Procedure(s): DILATATION AND CURETTAGE /HYSTEROSCOPY (N/A) as a surgical intervention.  The patient's history has been reviewed, patient examined, no change in status, stable for surgery.  I have reviewed the patient's chart and labs.  Questions were answered to the patient's satisfaction.     Nena A Olisa Quesnel

## 2023-12-22 NOTE — Anesthesia Postprocedure Evaluation (Signed)
 Anesthesia Post Note  Patient: [name]  Procedure(s) Performed: DILATATION AND CURETTAGE /HYSTEROSCOPY     Patient location during evaluation: PACU Anesthesia Type: MAC Level of consciousness: awake Pain management: pain level controlled Vital Signs Assessment: post-procedure vital signs reviewed and stable Respiratory status: spontaneous breathing, nonlabored ventilation and respiratory function stable Cardiovascular status: blood pressure returned to baseline and stable Postop Assessment: no apparent nausea or vomiting Anesthetic complications: no   No notable events documented.  Last Vitals:  Vitals:   12/22/23 1515 12/22/23 1519  BP: (!) 110/94 (!) 110/94  Pulse: 66 63  Resp: 18 17  Temp: 36.5 C   SpO2: (!) 67% 98%    Last Pain:  Vitals:   12/22/23 1515  TempSrc:   PainSc: 0-No pain                 Jeneal Vogl P Lashunta Frieden

## 2023-12-23 ENCOUNTER — Encounter (HOSPITAL_COMMUNITY): Payer: Self-pay | Admitting: Obstetrics and Gynecology

## 2023-12-23 LAB — SURGICAL PATHOLOGY

## 2024-01-03 DIAGNOSIS — R109 Unspecified abdominal pain: Secondary | ICD-10-CM | POA: Diagnosis not present

## 2024-01-03 DIAGNOSIS — R059 Cough, unspecified: Secondary | ICD-10-CM | POA: Diagnosis not present

## 2024-03-09 ENCOUNTER — Encounter: Payer: Self-pay | Admitting: Podiatry

## 2024-03-09 ENCOUNTER — Ambulatory Visit: Admitting: Podiatry

## 2024-03-09 DIAGNOSIS — M79674 Pain in right toe(s): Secondary | ICD-10-CM

## 2024-03-09 DIAGNOSIS — R059 Cough, unspecified: Secondary | ICD-10-CM | POA: Diagnosis not present

## 2024-03-09 DIAGNOSIS — E119 Type 2 diabetes mellitus without complications: Secondary | ICD-10-CM | POA: Diagnosis not present

## 2024-03-09 DIAGNOSIS — I700 Atherosclerosis of aorta: Secondary | ICD-10-CM | POA: Diagnosis not present

## 2024-03-09 DIAGNOSIS — R0989 Other specified symptoms and signs involving the circulatory and respiratory systems: Secondary | ICD-10-CM | POA: Diagnosis not present

## 2024-03-09 DIAGNOSIS — I771 Stricture of artery: Secondary | ICD-10-CM | POA: Diagnosis not present

## 2024-03-09 DIAGNOSIS — B351 Tinea unguium: Secondary | ICD-10-CM | POA: Diagnosis not present

## 2024-03-09 DIAGNOSIS — M79675 Pain in left toe(s): Secondary | ICD-10-CM

## 2024-03-09 DIAGNOSIS — L84 Corns and callosities: Secondary | ICD-10-CM

## 2024-03-13 DIAGNOSIS — R059 Cough, unspecified: Secondary | ICD-10-CM | POA: Diagnosis not present

## 2024-03-13 DIAGNOSIS — Z03818 Encounter for observation for suspected exposure to other biological agents ruled out: Secondary | ICD-10-CM | POA: Diagnosis not present

## 2024-03-16 NOTE — Progress Notes (Signed)
 Subjective:  Patient ID: [name], female    DOB: 10-18-1955,  MRN: [id number]  [name] presents to clinic today for preventative diabetic foot care and callus(es) of both feet and painful thick toenails that are difficult to trim. Painful toenails interfere with ambulation. Aggravating factors include wearing enclosed shoe gear. Pain is relieved with periodic professional debridement. Painful calluses are aggravated when weightbearing with and without shoegear. Pain is relieved with periodic professional debridement.  Chief Complaint  Patient presents with   Toe Pain    Dr. Rexanne is her PCP. She is unsure of her A1c and denies every taking her glucose.   New problem(s): None.   PCP is Rexanne Ingle, MD.  Allergies  Allergen Reactions   Iodinated Contrast Media Shortness Of Breath, Nausea And Vomiting and Swelling    Throat swelling   Multihance  [Gadobenate Dimeglumine ] Hives, Itching and Nausea Only    Pt had breast mri with multihance , was at first nauseated, clammy and dizzy. Then after about 15 minutes had rash,itching and hives. Treated with 50mg  of benadryl  at office and seen by dr. shoshana   Amoxicillin [Amoxicillin] Swelling   Gluten Meal    Kiwi Extract Nausea And Vomiting and Rash    Review of Systems: Negative except as noted in the HPI.  Objective: No changes noted in today's physical examination. There were no vitals filed for this visit. [name] is a pleasant 68 y.o. female in NAD. AAO x 3.  Vascular Examination: Capillary refill time immediate b/l. Vascular status intact b/l with palpable pedal pulses. Pedal hair present b/l. No pain with calf compression b/l. Skin temperature gradient WNL b/l. No cyanosis or clubbing b/l. No ischemia or gangrene noted b/l. No varicosities noted.  Neurological Examination: Sensation grossly intact b/l with 10 gram monofilament. Vibratory sensation intact b/l.   Dermatological Examination: Pedal  skin with normal turgor, texture and tone b/l.  No open wounds. No interdigital macerations.   Toenails 1-5 b/l thick, discolored, elongated with subungual debris and pain on dorsal palpation.   Hyperkeratotic lesion(s) submet head 5 b/l.  No erythema, no edema, no drainage, no fluctuance.  Musculoskeletal Examination: Muscle strength 5/5 to all lower extremity muscle groups bilaterally. No pain, crepitus or joint limitation noted with ROM bilateral LE. HAV with bunion deformity noted b/l LE.  Radiographs: None  Assessment/Plan: 1. Pain due to onychomycosis of toenails of both feet   2. Callus   3. Diabetes mellitus without complication [facility])   Patient was evaluated and treated. All patient's and/or POA's questions/concerns addressed on today's visit. Toenails 1-5 b/l debrided in length and girth without incident. Callus(es) submet head 5 b/l pared with sharp debridement without incident. Continue daily foot inspections and monitor blood glucose per PCP/Endocrinologist's recommendations. Continue soft, supportive shoe gear daily. Report any pedal injuries to medical professional. Call office if there are any questions/concerns.  Return in about 3 months (around 06/09/2024).  [name], DPM      [place] LOCATION: [address] [place] [address]                   Office [phone]   [place] LOCATION: [address] [place], [place]  [address] Office [phone]

## 2024-06-25 ENCOUNTER — Ambulatory Visit: Admitting: Podiatry
